# Patient Record
Sex: Male | Born: 1960
Health system: Southern US, Community
[De-identification: ages and names within clinical notes are randomized; demographics above are authoritative.]

## PROBLEM LIST (undated history)

## (undated) DIAGNOSIS — I10 Essential (primary) hypertension: Secondary | ICD-10-CM

---

## 2018-01-31 ENCOUNTER — Encounter (HOSPITAL_COMMUNITY): Payer: Self-pay | Admitting: Emergency Medicine

## 2018-01-31 ENCOUNTER — Other Ambulatory Visit: Payer: Self-pay

## 2018-01-31 ENCOUNTER — Emergency Department (HOSPITAL_COMMUNITY): Payer: Self-pay

## 2018-01-31 ENCOUNTER — Inpatient Hospital Stay (HOSPITAL_COMMUNITY)
Admission: EM | Admit: 2018-01-31 | Discharge: 2018-02-07 | DRG: 445 | Disposition: A | Discharge status: Home / Self Care | Payer: Self-pay | Attending: Internal Medicine | Admitting: Internal Medicine

## 2018-01-31 ENCOUNTER — Inpatient Hospital Stay (HOSPITAL_COMMUNITY): Payer: Self-pay

## 2018-01-31 DIAGNOSIS — C240 Malignant neoplasm of extrahepatic bile duct: Secondary | ICD-10-CM

## 2018-01-31 DIAGNOSIS — R7401 Elevation of levels of liver transaminase levels: Secondary | ICD-10-CM

## 2018-01-31 DIAGNOSIS — F1210 Cannabis abuse, uncomplicated: Secondary | ICD-10-CM | POA: Diagnosis present

## 2018-01-31 DIAGNOSIS — R740 Nonspecific elevation of levels of transaminase and lactic acid dehydrogenase [LDH]: Secondary | ICD-10-CM

## 2018-01-31 DIAGNOSIS — R109 Unspecified abdominal pain: Secondary | ICD-10-CM

## 2018-01-31 DIAGNOSIS — D630 Anemia in neoplastic disease: Secondary | ICD-10-CM | POA: Diagnosis present

## 2018-01-31 DIAGNOSIS — Z6822 Body mass index (BMI) 22.0-22.9, adult: Secondary | ICD-10-CM

## 2018-01-31 DIAGNOSIS — R17 Unspecified jaundice: Secondary | ICD-10-CM | POA: Diagnosis present

## 2018-01-31 DIAGNOSIS — F1010 Alcohol abuse, uncomplicated: Secondary | ICD-10-CM

## 2018-01-31 DIAGNOSIS — F17210 Nicotine dependence, cigarettes, uncomplicated: Secondary | ICD-10-CM | POA: Diagnosis present

## 2018-01-31 DIAGNOSIS — R1011 Right upper quadrant pain: Secondary | ICD-10-CM

## 2018-01-31 DIAGNOSIS — C23 Malignant neoplasm of gallbladder: Secondary | ICD-10-CM | POA: Diagnosis present

## 2018-01-31 DIAGNOSIS — E440 Moderate protein-calorie malnutrition: Secondary | ICD-10-CM

## 2018-01-31 DIAGNOSIS — R1010 Upper abdominal pain, unspecified: Secondary | ICD-10-CM

## 2018-01-31 DIAGNOSIS — Z597 Insufficient social insurance and welfare support: Secondary | ICD-10-CM

## 2018-01-31 DIAGNOSIS — R630 Anorexia: Secondary | ICD-10-CM

## 2018-01-31 DIAGNOSIS — Z720 Tobacco use: Secondary | ICD-10-CM

## 2018-01-31 DIAGNOSIS — F1410 Cocaine abuse, uncomplicated: Secondary | ICD-10-CM | POA: Diagnosis present

## 2018-01-31 DIAGNOSIS — K831 Obstruction of bile duct: Principal | ICD-10-CM

## 2018-01-31 DIAGNOSIS — Z9114 Patient's other noncompliance with medication regimen: Secondary | ICD-10-CM

## 2018-01-31 DIAGNOSIS — C772 Secondary and unspecified malignant neoplasm of intra-abdominal lymph nodes: Secondary | ICD-10-CM | POA: Diagnosis present

## 2018-01-31 DIAGNOSIS — E876 Hypokalemia: Secondary | ICD-10-CM | POA: Diagnosis present

## 2018-01-31 DIAGNOSIS — I10 Essential (primary) hypertension: Secondary | ICD-10-CM | POA: Diagnosis present

## 2018-01-31 DIAGNOSIS — Z590 Homelessness: Secondary | ICD-10-CM

## 2018-01-31 DIAGNOSIS — F1910 Other psychoactive substance abuse, uncomplicated: Secondary | ICD-10-CM

## 2018-01-31 DIAGNOSIS — R945 Abnormal results of liver function studies: Secondary | ICD-10-CM

## 2018-01-31 DIAGNOSIS — R110 Nausea: Secondary | ICD-10-CM

## 2018-01-31 HISTORY — DX: Essential (primary) hypertension: I10

## 2018-01-31 LAB — CBC WITH DIFFERENTIAL/PLATELET
BASOS PCT: 0 %
Basophils Absolute: 0 10*3/uL (ref 0.0–0.1)
EOS PCT: 1 %
Eosinophils Absolute: 0.1 10*3/uL (ref 0.0–0.7)
HEMATOCRIT: 31.7 % — AB (ref 39.0–52.0)
HEMOGLOBIN: 10.9 g/dL — AB (ref 13.0–17.0)
LYMPHS PCT: 16 %
Lymphs Abs: 1.7 10*3/uL (ref 0.7–4.0)
MCH: 30.9 pg (ref 26.0–34.0)
MCHC: 34.4 g/dL (ref 30.0–36.0)
MCV: 89.8 fL (ref 78.0–100.0)
Monocytes Absolute: 0.9 10*3/uL (ref 0.1–1.0)
Monocytes Relative: 9 %
NEUTROS ABS: 7.7 10*3/uL (ref 1.7–7.7)
Neutrophils Relative %: 74 %
Platelets: 405 10*3/uL — ABNORMAL HIGH (ref 150–400)
RBC: 3.53 MIL/uL — ABNORMAL LOW (ref 4.22–5.81)
RDW: 18.2 % — ABNORMAL HIGH (ref 11.5–15.5)
WBC: 10.4 10*3/uL (ref 4.0–10.5)

## 2018-01-31 LAB — COMPREHENSIVE METABOLIC PANEL
ALBUMIN: 2.5 g/dL — AB (ref 3.5–5.0)
ALK PHOS: 525 U/L — AB (ref 38–126)
ALT: 144 U/L — ABNORMAL HIGH (ref 17–63)
ANION GAP: 10 (ref 5–15)
AST: 139 U/L — ABNORMAL HIGH (ref 15–41)
BILIRUBIN TOTAL: 25.5 mg/dL — AB (ref 0.3–1.2)
BUN: 6 mg/dL (ref 6–20)
CO2: 23 mmol/L (ref 22–32)
Calcium: 9.3 mg/dL (ref 8.9–10.3)
Chloride: 101 mmol/L (ref 101–111)
Creatinine, Ser: 0.4 mg/dL — ABNORMAL LOW (ref 0.61–1.24)
GFR calc non Af Amer: >60 mL/min (ref >60)
GLUCOSE: 93 mg/dL (ref 65–99)
Potassium: 3.2 mmol/L — ABNORMAL LOW (ref 3.5–5.1)
Sodium: 134 mmol/L — ABNORMAL LOW (ref 135–145)
Total Protein: 7 g/dL (ref 6.5–8.1)

## 2018-01-31 LAB — URINALYSIS, ROUTINE W REFLEX MICROSCOPIC
GLUCOSE, UA: NEGATIVE mg/dL
Hgb urine dipstick: NEGATIVE
KETONES UR: NEGATIVE mg/dL
LEUKOCYTES UA: NEGATIVE
Nitrite: NEGATIVE
PH: 6 (ref 5.0–8.0)
PROTEIN: NEGATIVE mg/dL
Specific Gravity, Urine: 1.012 (ref 1.005–1.030)

## 2018-01-31 LAB — RAPID URINE DRUG SCREEN, HOSP PERFORMED
AMPHETAMINES: NOT DETECTED
Barbiturates: NOT DETECTED
Benzodiazepines: NOT DETECTED
COCAINE: POSITIVE — AB
OPIATES: NOT DETECTED
Tetrahydrocannabinol: NOT DETECTED

## 2018-01-31 LAB — LIPASE, BLOOD: Lipase: 41 U/L (ref 11–51)

## 2018-01-31 LAB — CREATININE, SERUM
CREATININE: 0.37 mg/dL — AB (ref 0.61–1.24)
GFR calc Af Amer: >60 mL/min (ref >60)

## 2018-01-31 LAB — I-STAT CG4 LACTIC ACID, ED: Lactic Acid, Venous: 0.65 mmol/L (ref 0.5–1.9)

## 2018-01-31 LAB — ETHANOL: Alcohol, Ethyl (B): <10 mg/dL (ref <10)

## 2018-01-31 LAB — HIV ANTIBODY (ROUTINE TESTING W REFLEX): HIV Screen 4th Generation wRfx: NONREACTIVE

## 2018-01-31 LAB — CBC
HEMATOCRIT: 29.6 % — AB (ref 39.0–52.0)
HEMOGLOBIN: 10.2 g/dL — AB (ref 13.0–17.0)
MCH: 30.7 pg (ref 26.0–34.0)
MCHC: 34.5 g/dL (ref 30.0–36.0)
MCV: 89.2 fL (ref 78.0–100.0)
Platelets: 387 10*3/uL (ref 150–400)
RBC: 3.32 MIL/uL — ABNORMAL LOW (ref 4.22–5.81)
RDW: 18.2 % — AB (ref 11.5–15.5)
WBC: 10.5 10*3/uL (ref 4.0–10.5)

## 2018-01-31 LAB — PROTIME-INR
INR: 0.97
Prothrombin Time: 12.8 seconds (ref 11.4–15.2)

## 2018-01-31 LAB — GLUCOSE, CAPILLARY
GLUCOSE-CAPILLARY: 66 mg/dL (ref 65–99)
GLUCOSE-CAPILLARY: 113 mg/dL — AB (ref 65–99)
Glucose-Capillary: 121 mg/dL — ABNORMAL HIGH (ref 65–99)

## 2018-01-31 MED ORDER — THIAMINE HCL 100 MG/ML IJ SOLN: 100.0000 mg | Freq: Every day | INTRAMUSCULAR | Status: DC

## 2018-01-31 MED ORDER — ENSURE ENLIVE PO LIQD: 237.0000 mL | Freq: Two times a day (BID) | ORAL | Status: DC

## 2018-01-31 MED ORDER — GUAIFENESIN-DM 100-10 MG/5ML PO SYRP: 5.0000 mL | ORAL_SOLUTION | ORAL | Status: DC | PRN

## 2018-01-31 MED ORDER — VITAMIN B-1 100 MG PO TABS
100.0000 mg | ORAL_TABLET | Freq: Every day | ORAL | Status: DC
  Administered 2018-01-31 – 2018-02-07 (×7): 100 mg via ORAL
  Filled 2018-01-31 (×7): qty 1

## 2018-01-31 MED ORDER — PHENOL 1.4 % MT LIQD
1.0000 | OROMUCOSAL | Status: DC | PRN
  Filled 2018-01-31: qty 177

## 2018-01-31 MED ORDER — FOLIC ACID 1 MG PO TABS
1.0000 mg | ORAL_TABLET | Freq: Every day | ORAL | Status: DC
  Administered 2018-01-31 – 2018-02-07 (×7): 1 mg via ORAL
  Filled 2018-01-31 (×7): qty 1

## 2018-01-31 MED ORDER — POLYVINYL ALCOHOL 1.4 % OP SOLN
1.0000 [drp] | OPHTHALMIC | Status: DC | PRN
  Filled 2018-01-31: qty 15

## 2018-01-31 MED ORDER — MUSCLE RUB 10-15 % EX CREA
1.0000 "application " | TOPICAL_CREAM | CUTANEOUS | Status: DC | PRN
  Filled 2018-01-31: qty 85

## 2018-01-31 MED ORDER — LIP MEDEX EX OINT
1.0000 "application " | TOPICAL_OINTMENT | CUTANEOUS | Status: DC | PRN
  Filled 2018-01-31 (×2): qty 7

## 2018-01-31 MED ORDER — ADULT MULTIVITAMIN W/MINERALS CH
1.0000 | ORAL_TABLET | Freq: Every day | ORAL | Status: DC
  Administered 2018-01-31 – 2018-02-07 (×7): 1 via ORAL
  Filled 2018-01-31 (×7): qty 1

## 2018-01-31 MED ORDER — SALINE SPRAY 0.65 % NA SOLN
1.0000 | NASAL | Status: DC | PRN
  Filled 2018-01-31: qty 44

## 2018-01-31 MED ORDER — PIPERACILLIN-TAZOBACTAM 3.375 G IVPB 30 MIN
3.3750 g | Freq: Once | INTRAVENOUS | Status: AC
  Administered 2018-01-31: 3.375 g via INTRAVENOUS
  Filled 2018-01-31: qty 50

## 2018-01-31 MED ORDER — ALUM & MAG HYDROXIDE-SIMETH 200-200-20 MG/5ML PO SUSP
30.0000 mL | ORAL | Status: DC | PRN
  Administered 2018-02-03 – 2018-02-05 (×2): 30 mL via ORAL
  Filled 2018-01-31 (×2): qty 30

## 2018-01-31 MED ORDER — INSULIN ASPART 100 UNIT/ML ~~LOC~~ SOLN
0.0000 [IU] | Freq: Three times a day (TID) | SUBCUTANEOUS | Status: DC
  Administered 2018-02-02: 5 [IU] via SUBCUTANEOUS
  Administered 2018-02-04 – 2018-02-07 (×3): 1 [IU] via SUBCUTANEOUS

## 2018-01-31 MED ORDER — HYDROCORTISONE 2.5 % RE CREA
1.0000 "application " | TOPICAL_CREAM | Freq: Four times a day (QID) | RECTAL | Status: DC | PRN
  Filled 2018-01-31: qty 28.35

## 2018-01-31 MED ORDER — SODIUM CHLORIDE 0.9 % IJ SOLN
INTRAMUSCULAR | Status: AC
  Filled 2018-01-31: qty 50

## 2018-01-31 MED ORDER — LORAZEPAM 2 MG/ML IJ SOLN: 1.0000 mg | Freq: Four times a day (QID) | INTRAMUSCULAR | Status: AC | PRN

## 2018-01-31 MED ORDER — IOPAMIDOL (ISOVUE-300) INJECTION 61%
100.0000 mL | Freq: Once | INTRAVENOUS | Status: AC | PRN
  Administered 2018-01-31: 100 mL via INTRAVENOUS

## 2018-01-31 MED ORDER — HYDROCODONE-ACETAMINOPHEN 5-325 MG PO TABS
1.0000 | ORAL_TABLET | ORAL | Status: DC | PRN
  Administered 2018-01-31 – 2018-02-06 (×2): 1 via ORAL
  Filled 2018-01-31 (×2): qty 1

## 2018-01-31 MED ORDER — IOPAMIDOL (ISOVUE-300) INJECTION 61%
INTRAVENOUS | Status: AC
  Administered 2018-01-31: 30 mL via ORAL
  Filled 2018-01-31: qty 30

## 2018-01-31 MED ORDER — HYDROCORTISONE 1 % EX CREA
1.0000 "application " | TOPICAL_CREAM | Freq: Three times a day (TID) | CUTANEOUS | Status: DC | PRN
  Filled 2018-01-31: qty 28

## 2018-01-31 MED ORDER — IOPAMIDOL (ISOVUE-300) INJECTION 61%
INTRAVENOUS | Status: AC
  Filled 2018-01-31: qty 100

## 2018-01-31 MED ORDER — HEPARIN SODIUM (PORCINE) 5000 UNIT/ML IJ SOLN
5000.0000 [IU] | Freq: Three times a day (TID) | INTRAMUSCULAR | Status: DC
  Administered 2018-01-31 – 2018-02-07 (×20): 5000 [IU] via SUBCUTANEOUS
  Filled 2018-01-31 (×21): qty 1

## 2018-01-31 MED ORDER — SODIUM CHLORIDE 0.9 % IV BOLUS (SEPSIS)
1000.0000 mL | Freq: Once | INTRAVENOUS | Status: AC
  Administered 2018-01-31: 1000 mL via INTRAVENOUS

## 2018-01-31 MED ORDER — LORAZEPAM 1 MG PO TABS: 1.0000 mg | ORAL_TABLET | Freq: Four times a day (QID) | ORAL | Status: AC | PRN

## 2018-01-31 MED ORDER — IOPAMIDOL (ISOVUE-300) INJECTION 61%: 30.0000 mL | Freq: Once | INTRAVENOUS | Status: DC

## 2018-01-31 MED ORDER — SENNOSIDES-DOCUSATE SODIUM 8.6-50 MG PO TABS
1.0000 | ORAL_TABLET | Freq: Every evening | ORAL | Status: DC | PRN
  Administered 2018-02-05: 1 via ORAL
  Filled 2018-01-31: qty 1

## 2018-01-31 MED ORDER — PIPERACILLIN-TAZOBACTAM 3.375 G IVPB
3.3750 g | Freq: Three times a day (TID) | INTRAVENOUS | Status: DC
  Administered 2018-01-31 – 2018-02-04 (×11): 3.375 g via INTRAVENOUS
  Filled 2018-01-31 (×8): qty 50

## 2018-01-31 MED ORDER — ENSURE ENLIVE PO LIQD
237.0000 mL | Freq: Three times a day (TID) | ORAL | Status: DC
  Administered 2018-02-01 – 2018-02-05 (×5): 237 mL via ORAL

## 2018-01-31 MED ORDER — DEXTROSE-NACL 5-0.45 % IV SOLN
INTRAVENOUS | Status: AC
  Administered 2018-01-31 (×2): via INTRAVENOUS

## 2018-01-31 MED ORDER — LORATADINE 10 MG PO TABS: 10.0000 mg | ORAL_TABLET | Freq: Every day | ORAL | Status: DC | PRN

## 2018-01-31 NOTE — ED Notes (Signed)
Pt has urinal at bedside 

## 2018-01-31 NOTE — Progress Notes (Addendum)
Pharmacy Antibiotic Note  Leroy Harrell is a 57 y.o. male admitted on 01/31/2018 with c/o abd pain/fatigue, jaundice. LFTs elevated.  Pharmacy has been consulted for Zosyn dosing for Intra-abd infection.  Plan: Zosyn 3.375g IV q8h (4 hour infusion).    Temp (24hrs), Avg:98.8 F (37.1 C), Min:98.8 F (37.1 C), Max:98.8 F (37.1 C)  Recent Labs  Lab 01/31/18 0544 01/31/18 0555  WBC 10.4  --   CREATININE 0.40*  --   LATICACIDVEN  --  0.65    CrCl cannot be calculated (Unknown ideal weight.).    No Known Allergies  Antimicrobials this admission: 3/22 Zosyn >>    Microbiology results: 3/22 BCx: sent 3/22 HIV Ab: sent  Pharmacy will sign-off note writing, will adjust dose/schedule if necessary Thank you for allowing pharmacy to be a part of this patient's care.  Minda Ditto PharmD Pager [phone] 01/31/2018, 10:59 AM

## 2018-01-31 NOTE — Consult Note (Addendum)
Referring Provider: Dr. Reesa Chew Primary Care Physician:  System, Pcp Not In Primary Gastroenterologist:  Althia Forts, [place]  Reason for Consultation:  Obstructive jaundice  HPI: Leroy Harrell is a 57 y.o. male with medical history significant of essential hypertension, alcohol abuse, tobacco abuse, polysubstance abuse comes to the hospital for evaluation of abdominal pain and fatigue.  Patient states he has been experiencing fatigue, malaise, yellowish discoloration of his eyes and gas-like abdominal pain especially in the right upper quadrant/upper abdomen for the past 4 days.  During this time he has felt mildly nauseous, had subjective fevers and chills, malaise and fatigue.  Due to this his appetite has been extremely poor and has not had pretty much anything to eat or drink.  Admits to about a 10 pound weight loss over the past month or so.  He was driving down from [place] where he was visiting his brother back to [place] where he lives, but his symptoms worsened, therefore, came to the hospital for evaluation.  Had noticed his eyes turning very yellow about 3-4 days ago as well.  Urine is very dark in color and stools are light in color.  Patient denies any use of Tylenol, NSAIDs.  He does admit of drinking heavily for at least 10 years.  He says that he drinks about 1 pint of vodka or 6 beers daily at least 3-4 times (days) per week.  Admits of using crack cocaine and marijuana frequently.  Denies any previous history or issues with gallbladder, gallstone, peptic ulcer disease or any other abdominal issues.  Upon lab evaluation he was found to have the following:  Hgb low at 10.9 grams, normocytic.  K+ 3.2.  Albumin 2.5, AST 139, ALT 144, ALP 525, total bili 25.5.  INR 0.97.  Ultrasound of the abdomen was performed and showed the following:   IMPRESSION: 1. Dilated common bile duct with intrahepatic biliary ductal dilatation of uncertain etiology. No  obstructing mass or choledocholithiasis is identified. Recommend further evaluation with MR abdomen/MRCP. 2. Severe gallbladder wall thickening measuring up to 10 mm with a trace amount of pericholecystic fluid without cholelithiasis. This appearance can be seen in the setting of acalculus cholecystitis.   Past Medical History:  Diagnosis Date  . Hypertension    History reviewed. No pertinent surgical history.  Prior to Admission medications   Not on File    Current Facility-Administered Medications  Medication Dose Route Frequency Provider Last Rate Last Dose  . alum & mag hydroxide-simeth (MAALOX/MYLANTA) 200-200-20 MG/5ML suspension 30 mL  30 mL Oral Q4H PRN Amin, Ankit Chirag, MD      . dextrose 5 %-0.45 % sodium chloride infusion   Intravenous Continuous Amin, Ankit Chirag, MD      . folic acid (FOLVITE) tablet 1 mg  1 mg Oral Daily Amin, Ankit Chirag, MD      . guaiFENesin-dextromethorphan (ROBITUSSIN DM) 100-10 MG/5ML syrup 5 mL  5 mL Oral Q4H PRN Amin, Ankit Chirag, MD      . heparin injection 5,000 Units  5,000 Units Subcutaneous Q8H Amin, Ankit Chirag, MD      . HYDROcodone-acetaminophen (NORCO/VICODIN) 5-325 MG per tablet 1-2 tablet  1-2 tablet Oral Q4H PRN Amin, Ankit Chirag, MD      . hydrocortisone (ANUSOL-HC) 2.5 % rectal cream 1 application  1 application Topical QID PRN Amin, Ankit Chirag, MD      . hydrocortisone cream 1 % 1 application  1 application Topical TID PRN  Amin, Ankit Chirag, MD      . insulin aspart (novoLOG) injection 0-9 Units  0-9 Units Subcutaneous TID WC Amin, Ankit Chirag, MD      . iopamidol (ISOVUE-300) 61 % injection 30 mL  30 mL Oral Once Amin, Ankit Chirag, MD      . lip balm (CARMEX) ointment 1 application  1 application Topical PRN Amin, Ankit Chirag, MD      . loratadine (CLARITIN) tablet 10 mg  10 mg Oral Daily PRN Amin, Ankit Chirag, MD      . LORazepam (ATIVAN) tablet 1 mg  1 mg Oral Q6H PRN Amin, Jeanella Flattery, MD       Or  . LORazepam  (ATIVAN) injection 1 mg  1 mg Intravenous Q6H PRN Amin, Ankit Chirag, MD      . multivitamin with minerals tablet 1 tablet  1 tablet Oral Daily Amin, Ankit Chirag, MD      . MUSCLE RUB CREA 1 application  1 application Topical PRN Amin, Ankit Chirag, MD      . phenol (CHLORASEPTIC) mouth spray 1 spray  1 spray Mouth/Throat PRN Amin, Ankit Chirag, MD      . piperacillin-tazobactam (ZOSYN) IVPB 3.375 g  3.375 g Intravenous Once Nyoka Cowden, Terri L, RPH      . polyvinyl alcohol (LIQUIFILM TEARS) 1.4 % ophthalmic solution 1 drop  1 drop Both Eyes PRN Amin, Ankit Chirag, MD      . senna-docusate (Senokot-S) tablet 1 tablet  1 tablet Oral QHS PRN Amin, Ankit Chirag, MD      . sodium chloride (OCEAN) 0.65 % nasal spray 1 spray  1 spray Each Nare PRN Amin, Ankit Chirag, MD      . thiamine (VITAMIN B-1) tablet 100 mg  100 mg Oral Daily Amin, Ankit Chirag, MD       Or  . thiamine (B-1) injection 100 mg  100 mg Intravenous Daily Amin, Jeanella Flattery, MD       No current outpatient medications on file.    Allergies as of 01/31/2018  . (No Known Allergies)    No family history on file.  Social History   Socioeconomic History  . Marital status: Single    Spouse name: Not on file  . Number of children: Not on file  . Years of education: Not on file  . Highest education level: Not on file  Occupational History  . Not on file  Social Needs  . Financial resource strain: Not on file  . Food insecurity:    Worry: Not on file    Inability: Not on file  . Transportation needs:    Medical: Not on file    Non-medical: Not on file  Tobacco Use  . Smoking status: Current Every Day Smoker    Packs/day: 0.50    Years: 30.00    Pack years: 15.00    Types: Cigarettes  . Smokeless tobacco: Never Used  Substance and Sexual Activity  . Alcohol use: Yes    Comment: occasionally  . Drug use: Yes    Frequency: 1.0 times per week    Types: Cocaine  . Sexual activity: Not on file  Lifestyle  . Physical  activity:    Days per week: Not on file    Minutes per session: Not on file  . Stress: Not on file  Relationships  . Social connections:    Talks on phone: Not on file    Gets together: Not on file  Attends religious service: Not on file    Active member of club or organization: Not on file    Attends meetings of clubs or organizations: Not on file    Relationship status: Not on file  . Intimate partner violence:    Fear of current or ex partner: Not on file    Emotionally abused: Not on file    Physically abused: Not on file    Forced sexual activity: Not on file  Other Topics Concern  . Not on file  Social History Narrative  . Not on file    Review of Systems: ROS is O/W negative except as mentioned in HPI.  Physical Exam: Vital signs in last 24 hours: Temp:  [98.8 F (37.1 C)] 98.8 F (37.1 C) (03/22 0508) Pulse Rate:  [61-82] 65 (03/22 0953) Resp:  [16-18] 18 (03/22 0953) BP: (128-152)/(81-91) 136/91 (03/22 0953) SpO2:  [96 %-99 %] 96 % (03/22 0953)   General:  Alert, Well-developed, well-nourished, pleasant and cooperative in NAD Head:  Normocephalic and atraumatic. Eyes:  Deep scleral icterus noted. Ears:  Normal auditory acuity. Mouth:  No deformity or lesions.   Lungs:  Clear throughout to auscultation.   No wheezes, crackles, or rhonchi.  Heart:  Regular rate and rhythm; no murmurs, clicks, rubs, or gallops. Abdomen:  Soft, non-distended.  BS present.  Mild upper abdominal TTP. Rectal:  Deferred  Msk:  Symmetrical without gross deformities. Pulses:  Normal pulses noted. Extremities:  Without clubbing or edema. Neurologic:  Alert and oriented x 4;  grossly normal neurologically. Skin:  Intact without significant lesions or rashes. .Psych:  Alert and cooperative. Normal mood and affect.  Lab Results: Recent Labs    01/31/18 0544  WBC 10.4  HGB 10.9*  HCT 31.7*  PLT 405*   BMET Recent Labs    01/31/18 0544  NA 134*  K 3.2*  CL 101  CO2 23    GLUCOSE 93  BUN 6  CREATININE 0.40*  CALCIUM 9.3   LFT Recent Labs    01/31/18 0544  PROT 7.0  ALBUMIN 2.5*  AST 139*  ALT 144*  ALKPHOS 525*  BILITOT 25.5*   PT/INR Recent Labs    01/31/18 0544  LABPROT 12.8  INR 0.97   Studies/Results: US Abdomen Complete  Result Date: 01/31/2018 CLINICAL DATA:  Jaundiced, nauseous EXAM: ABDOMEN ULTRASOUND COMPLETE COMPARISON:  None. FINDINGS: Gallbladder: No cholelithiasis. Gallbladder sludge is present. Severe gallbladder wall thickening measuring up to 10 mm. Trace pericholecystic fluid. Negative sonographic Murphy sign. Common bile duct: Diameter: 10.2 mm Liver: No focal lesion identified. Within normal limits in parenchymal echogenicity. Moderate intrahepatic biliary ductal dilatation in the right and left hepatic lobes. Portal vein is patent on color Doppler imaging with normal direction of blood flow towards the liver. IVC: No abnormality visualized. Pancreas: Visualized portion unremarkable. Spleen: Size and appearance within normal limits. Right Kidney: Length: 11.2 cm. Echogenicity within normal limits. No mass or hydronephrosis visualized. Left Kidney: Length: 11.2 cm. 1.2 x 1.6 x 1.1 cm anechoic left renal mass most consistent with a cyst. Echogenicity within normal limits. No solid mass or hydronephrosis visualized. Abdominal aorta: No aneurysm visualized. Other findings: None. IMPRESSION: 1. Dilated common bile duct with intrahepatic biliary ductal dilatation of uncertain etiology. No obstructing mass or choledocholithiasis is identified. Recommend further evaluation with MR abdomen/MRCP. 2. Severe gallbladder wall thickening measuring up to 10 mm with a trace amount of pericholecystic fluid without cholelithiasis. This appearance can be seen in the setting of  a calculus cholecystitis. Electronically Signed   By: Kathreen Devoid   On: 01/31/2018 09:33   IMPRESSION:  -Obstructive jaundice of unknown source.  ? Stone vs mass vs stricture,  etc. Total bili 25.  Upper abdominal tenderness.  PLAN: -Will await results of CT scan. -Agree with IV antibiotics, zosyn. -Monitor labs.   Laban Emperor. Zehr  01/31/2018, 10:04 AM  Pager number [phone]  I have reviewed the entire case in detail with the above APP and discussed the plan in detail.  Therefore, I agree with the diagnoses recorded above. In addition,  I have personally interviewed and examined the patient and have personally reviewed any abdominal/pelvic CT scan images.  My additional thoughts are as follows:  57 yo man with Etoh and drug use , no prior Hx pancreatitis or known GB problems, (does not seek regular medical care), presents with > 1 month 10-15 pound weight loss and now several days of upper abdominal pain, nausea, anorexia, malaise and jaundice and acholic stool.  Has RUQ TTP and GB wall thickening on [place], but I suspect this is obstructive jaundice.  Entire clinical scenario worrisome for pancreatic or distal biliary malignancy.  He may have superimposed biliary infection on top of this chronic process, so I agree with broad spectrum Abx to cover biliary tree. I advised [facility] doc to order CTAP with oral and IV contrast. He can have a regular diet.  We will follow him over the weekend.   [name] Pager [phone]  Mon-Fri 8a-5p [phone] after 5p, weekends, holidays

## 2018-01-31 NOTE — Progress Notes (Signed)
Initial Nutrition Assessment  DOCUMENTATION CODES:   Non-severe (moderate) malnutrition in context of social or environmental circumstances  INTERVENTION:   - Ensure Enlive po TID, each supplement provides 350 kcal and 20 grams of protein  - Double portions of protein at each meal  - Continue MVI with minerals  Monitor magnesium, potassium, and phosphorus daily for at least 3 days, MD to replete as needed, as pt is at risk for refeeding syndrome given moderate protein-calorie malnutrition, significant recent weight loss, no PO intake over the past 2 days, and hypokalemia upon admission.  NUTRITION DIAGNOSIS:   Moderate Malnutrition related to social / environmental circumstances(ETOH abuse, polysubstance abuse) as evidenced by percent weight loss, moderate muscle depletion, moderate fat depletion(17% weight loss in 3 months).  GOAL:   Patient will meet greater than or equal to 90% of their needs  MONITOR:   PO intake, Supplement acceptance, Labs, Weight trends  REASON FOR ASSESSMENT:   Malnutrition Screening Tool   ASSESSMENT:   57 year old male who presented to ED with body aches, chills, weakness, loss of appetite, jaundice, and emesis (2 episodes per day). Pt with PMH significant for HTN, alcohol abuse (has worsened recently), tobacco abuse, and polysubstance abuse.   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 Pt receiving second lunch tray at time of visit (chicken sandwich, chips, apple, Sprite) as he was very hungry, stating he hasn't eaten at all over the past 2 days due to feeling sick. Pt is pleased that his "appetite is coming back." Pt states that while he was in [place] with his brother over the past 2 months, he was eating less than usual (approximately 2 meals daily). A meal might consist of a sandwich. Prior to this time, pt states he was "eating well" and  cooking for himself at home.  Pt asked when he would receive his first Ensure. RD brought Ensure Enlive to pt and alerted RN.  Pt states that after his daughter's death 3 months ago, he has not been "eating properly" and has been consuming more alcohol during this time. Per H&P, pt was consuming approximately 1 pint of vodka almost daily and 6 beers/day 4 times a week.  Pt reports his UBW is 174 lbs and that he last weighed this 3 months ago. No weight history in chart. Pt endorses recent weight loss. Pt has experienced a 17% weight loss over the past 3 months which is significant for timeframe.  Medications reviewed and include: 1 mg folic acid daily, sliding scale Novolog, MVI with minerals, 100 mg thiamine daily, D5 infusing @ 100 mL/hr (provides 408 kcal/day), IV antibiotics  Labs reviewed: creatinine 0.37, sodium 134, potassium 3.2, alkaline phosphatase 525, AST 139, ALT 144  NUTRITION - FOCUSED PHYSICAL EXAM:    Most Recent Value  Orbital Region  Mild depletion  Upper Arm Region  Moderate depletion  Thoracic and Lumbar Region  Moderate depletion  Buccal Region  Mild depletion  Temple Region  Mild depletion  Clavicle Bone Region  Moderate depletion  Clavicle and Acromion Bone Region  Moderate depletion  Scapular Bone Region  Mild depletion  Dorsal Hand  No depletion  Patellar Region  Mild depletion  Anterior Thigh Region  Mild depletion  Posterior Calf Region  Mild depletion  Edema (RD Assessment)  None  Hair  Reviewed  Eyes  Reviewed  Mouth  Reviewed  Skin  Reviewed  Nails  Reviewed  Diet Order:  Diet regular Room service appropriate? Yes; Fluid consistency: Thin  EDUCATION NEEDS:   Education needs have been addressed  Skin:  Skin Assessment: Reviewed RN Assessment(jaundice)  Last BM:  01/31/18  Height:   Ht Readings from Last 1 Encounters:  01/31/18 5\' 7"  (1.702 m)    Weight:   Wt Readings from Last 1 Encounters:  01/31/18 144 lb 13.5 oz (65.7 kg)     Ideal Body Weight:  67.3 kg  BMI:  Body mass index is 22.69 kg/m.  Estimated Nutritional Needs:   Kcal:  1700-1900 kcal/day  Protein:  80-95 grams/day  Fluid:  1.7-1.9 L/day    Gaynell Face, MS, RD, LDN Pager: [phone] Weekend/After Hours: [phone]

## 2018-01-31 NOTE — ED Triage Notes (Signed)
Pt arriving with generalized body aches, chills, weakness, and loss of appetite. Pt concerned he may have Hepatitis. Jaundice evident in pt eyes. Pt states his eyes are lights now than they were earlier today.

## 2018-01-31 NOTE — ED Provider Notes (Signed)
[facility] DEPT Provider Note   CSN: [id number] Arrival date & time: 01/31/18  0449     History   Chief Complaint Chief Complaint  Patient presents with  . Jaundice  . Generalized Body Aches    HPI Leroy Harrell is a 57 y.o. male.  Patient is a 57 year old male who presents to the emergency department for evaluation of jaundice.  He is in route from [place] back to his home town in [place], but has been experiencing increasing fatigue and anorexia.  He has noticed these symptoms over the past 5-6 days.  He has since developed pain in his epigastric abdomen with sporadic vomiting throughout the day.  He reports approximately 2 episodes of emesis daily.  He has been having slightly looser stool which have been nonbloody.  He started to notice some discoloration over the past 3-4 days, specifically yellowing of the eyes.  He has had increased discoloration of his urine which is more brown in color.  He denies any recent fevers, but has had chills.  Denies any Tylenol use, IV drug use.  He does have a history of consistent crack cocaine use.  This was last used 2 days ago.  He has been drinking more heavily as of late; approximately 1 pint of liquor daily.  He denies noticing any increased bleeding or bruising.  No hx of abdominal surgeries.  The history is provided by the patient. No language interpreter was used.    Past Medical History:  Diagnosis Date  . Hypertension     There are no active problems to display for this patient.   History reviewed. No pertinent surgical history.     Home Medications    Prior to Admission medications   Not on File    Family History No family history on file.  Social History Social History   Tobacco Use  . Smoking status: Current Every Day Smoker    Packs/day: 0.50    Years: 30.00    Pack years: 15.00    Types: Cigarettes  . Smokeless tobacco: Never Used  Substance Use Topics  . Alcohol  use: Yes    Comment: occasionally  . Drug use: Yes    Frequency: 1.0 times per week    Types: Cocaine     Allergies   Patient has no known allergies.   Review of Systems Review of Systems Ten systems reviewed and are negative for acute change, except as noted in the HPI.    Physical Exam Updated Vital Signs BP (!) 142/81 (BP Location: Right Arm)   Pulse 71   Temp 98.8 F (37.1 C) (Oral)   Resp 18   SpO2 99%   Physical Exam  Constitutional: He is oriented to person, place, and time. He appears well-developed and well-nourished. No distress.  Nontoxic and in NAD  HENT:  Head: Normocephalic and atraumatic.  Eyes: Conjunctivae and EOM are normal. Scleral icterus is present.  Neck: Normal range of motion.  Cardiovascular: Normal rate, regular rhythm and intact distal pulses.  Pulmonary/Chest: Effort normal. No stridor. No respiratory distress. He has no wheezes.  Respirations even and unlabored  Abdominal: Soft. There is tenderness. There is no guarding.  Suspect hepatomegaly with TTP in the RUQ and epigastrium. Negative Murphy's sign. No distension, ascites, peritoneal signs.  Musculoskeletal: Normal range of motion.  Neurological: He is alert and oriented to person, place, and time. He exhibits normal muscle tone. Coordination normal.  Skin: Skin is warm and  dry. No rash noted. He is not diaphoretic. No erythema. No pallor.  Skin is jaundiced  Psychiatric: He has a normal mood and affect. His behavior is normal.  Nursing note and vitals reviewed.    ED Treatments / Results  Labs (all labs ordered are listed, but only abnormal results are displayed) Labs Reviewed  CBC WITH DIFFERENTIAL/PLATELET - Abnormal; Notable for the following components:      Result Value   RBC 3.53 (*)    Hemoglobin 10.9 (*)    HCT 31.7 (*)    RDW 18.2 (*)    Platelets 405 (*)    All other components within normal limits  COMPREHENSIVE METABOLIC PANEL - Abnormal; Notable for the following  components:   Sodium 134 (*)    Potassium 3.2 (*)    Creatinine, Ser 0.40 (*)    Albumin 2.5 (*)    AST 139 (*)    ALT 144 (*)    Alkaline Phosphatase 525 (*)    Total Bilirubin 25.5 (*)    All other components within normal limits  LIPASE, BLOOD  PROTIME-INR  ETHANOL  HEPATITIS PANEL, ACUTE  HIV ANTIBODY (ROUTINE TESTING)  RAPID URINE DRUG SCREEN, HOSP PERFORMED  I-STAT CG4 LACTIC ACID, ED    EKG  EKG Interpretation None       Radiology No results found.  Procedures Procedures (including critical care time)  Medications Ordered in ED Medications  sodium chloride 0.9 % bolus 1,000 mL (1,000 mLs Intravenous New Bag/Given 01/31/18 0554)     Initial Impression / Assessment and Plan / ED Course  I have reviewed the triage vital signs and the nursing notes.  Pertinent labs & imaging results that were available during my care of the patient were reviewed by me and considered in my medical decision making (see chart for details).     57 year old male presents to the emergency department for increased fatigue and jaundice.  He is afebrile with reassuring and stable vital signs.  Laboratory workup significant for transaminitis with elevation of alk phos to 525.  Total bilirubin significantly elevated at 25.5.  Patient denies any recent Tylenol use or IV drug use, though he does smoke crack cocaine.  He has also been drinking a pint of liquor daily more recently.  He denies any specific abdominal pain complaints, though he is tender in his right upper quadrant.  An ultrasound of the right upper quadrant is pending.  He will require admission for further management and inpatient GI consultation.  Case discussed with Dr. Reesa Chew of [facility] who will assess for admission.   Final Clinical Impressions(s) / ED Diagnoses   Final diagnoses:  Hyperbilirubinemia  Transaminitis    ED Discharge Orders    None       Antonietta Breach, PA-C 01/31/18 0721    Shanon Rosser, MD 01/31/18  2234

## 2018-01-31 NOTE — H&P (Signed)
History and Physical    Lavalle Skoda OFB:[id number] DOB: [birth year] DOA: 01/31/2018  PCP: System, Pcp Not In Patient coming from: home  Chief Complaint: fatigue  HPI: Leroy Harrell is a 57 y.o. male with medical history significant of essential hypertension, alcohol abuse, tobacco abuse, polysubstance abuse comes to the hospital for evaluation of abdominal pain and fatigue.  Patient states he has been experiencing fatigue, malaise, yellowish discoloration of his eyes and gas-like abdominal pain especially in the right upper quadrant for the past 3-4 days.  During this time he has felt mildly nauseous, had subjective fevers and chills, malaise and fatigue.  Due to this his appetite has been extremely poor and has not had pretty much anything to eat or drink.  He was driving down from [place] to [place] but his symptoms worsen therefore came to the hospital for evaluation. For the past couple of days he is also noticed his skin has been turning yellowish, unable to tell me much about  color of his stools. Patient denies any use of Tylenol, NSAIDs.  He does admit of drinking heavily for at least 10 years.  He is not necessarily open about exactly how much he drinks but he tells me he drinks about 1 pint of vodka almost daily along with at least 6 beers daily at least 4 times a week.  Admits of using crack cocaine and marijuana frequently.  Denies any previous history or issues with gallbladder, gallstone, peptic ulcer disease or any other abdominal issues.   In the ER he did not appear to be in any acute distress but his total bilirubin was noted to be 25.5, AST was 139, ALT 144, lipase 41, ALP 525.  CBC was unremarkable.  Lactate was 0.65.  INR was normal at 0.97, PT was 12.8.  Alcohol level was negative.  Right upper quadrant ultrasound was ordered and it was determined to admit him for further evaluation.  Review of Systems: As per HPI otherwise 10 point review of systems negative.    Review of Systems Otherwise negative except as per HPI, including: General: Denies fever, chills, night sweats or unintended weight loss. Resp: Denies cough, wheezing, shortness of breath. Cardiac: Denies chest pain, palpitations, orthopnea, paroxysmal nocturnal dyspnea. GI: Denies  diarrhea or constipation GU: Denies dysuria, frequency, hesitancy or incontinence MS: Denies muscle aches, joint pain or swelling Neuro: Denies headache, neurologic deficits (focal weakness, numbness, tingling), abnormal gait Psych: Denies anxiety, depression, SI/HI/AVH Skin: Denies new rashes or lesions ID: Denies sick contacts, exotic exposures, travel  Past Medical History:  Diagnosis Date  . Hypertension     History reviewed. No pertinent surgical history.   reports that he has been smoking cigarettes.  He has a 15.00 pack-year smoking history. He has never used smokeless tobacco. He reports that he drinks alcohol. He reports that he has current or past drug history. Drug: Cocaine. Frequency: 1.00 time per week.  No Known Allergies  Patient thinks his father had hypertension but not sure  Prior to Admission medications   Not on File    Physical Exam: Vitals:   01/31/18 0508 01/31/18 0629  BP: (!) 152/91 (!) 142/81  Pulse: 82 71  Resp: 16 18  Temp: 98.8 F (37.1 C)   TempSrc: Oral   SpO2: 96% 99%      Constitutional: NAD, calm, comfortable Vitals:   01/31/18 0508 01/31/18 0629  BP: (!) 152/91 (!) 142/81  Pulse: 82 71  Resp: 16 18  Temp:  98.8 F (37.1 C)   TempSrc: Oral   SpO2: 96% 99%   Eyes: PERRL, lids and scleral icterus ENMT: Mucous membranes are moist. Posterior pharynx clear of any exudate or lesions.Normal dentition.  Neck: normal, supple, no masses, no thyromegaly Respiratory: clear to auscultation bilaterally, no wheezing, no crackles. Normal respiratory effort. No accessory muscle use.  Cardiovascular: Regular rate and rhythm, no murmurs / rubs / gallops. No  extremity edema. 2+ pedal pulses. No carotid bruits.  Abdomen: Abdomen is tender to palpation especially right upper quadrant without any rebound tenderness, rigidity.  No distention noted. Musculoskeletal: no clubbing / cyanosis. No joint deformity upper and lower extremities. Good ROM, no contractures. Normal muscle tone.  Skin: Skin appears jaundiced no rashes, lesions, ulcers. No induration Neurologic: CN 2-12 grossly intact. Sensation intact, DTR normal. Strength 5/5 in all 4.  Psychiatric: Normal judgment and insight. Alert and oriented x 3. Normal mood.     Labs on Admission: I have personally reviewed following labs and imaging studies  CBC: Recent Labs  Lab 01/31/18 0544  WBC 10.4  NEUTROABS 7.7  HGB 10.9*  HCT 31.7*  MCV 89.8  PLT 660*   Basic Metabolic Panel: Recent Labs  Lab 01/31/18 0544  NA 134*  K 3.2*  CL 101  CO2 23  GLUCOSE 93  BUN 6  CREATININE 0.40*  CALCIUM 9.3   GFR: CrCl cannot be calculated (Unknown ideal weight.). Liver Function Tests: Recent Labs  Lab 01/31/18 0544  AST 139*  ALT 144*  ALKPHOS 525*  BILITOT 25.5*  PROT 7.0  ALBUMIN 2.5*   Recent Labs  Lab 01/31/18 0544  LIPASE 41   No results for input(s): AMMONIA in the last 168 hours. Coagulation Profile: Recent Labs  Lab 01/31/18 0544  INR 0.97   Cardiac Enzymes: No results for input(s): CKTOTAL, CKMB, CKMBINDEX, TROPONINI in the last 168 hours. BNP (last 3 results) No results for input(s): PROBNP in the last 8760 hours. HbA1C: No results for input(s): HGBA1C in the last 72 hours. CBG: No results for input(s): GLUCAP in the last 168 hours. Lipid Profile: No results for input(s): CHOL, HDL, LDLCALC, TRIG, CHOLHDL, LDLDIRECT in the last 72 hours. Thyroid Function Tests: No results for input(s): TSH, T4TOTAL, FREET4, T3FREE, THYROIDAB in the last 72 hours. Anemia Panel: No results for input(s): VITAMINB12, FOLATE, FERRITIN, TIBC, IRON, RETICCTPCT in the last 72  hours. Urine analysis: No results found for: COLORURINE, APPEARANCEUR, LABSPEC, PHURINE, GLUCOSEU, HGBUR, BILIRUBINUR, KETONESUR, PROTEINUR, UROBILINOGEN, NITRITE, LEUKOCYTESUR Sepsis Labs: !!!!!!!!!!!!!!!!!!!!!!!!!!!!!!!!!!!!!!!!!!!! @LABRCNTIP (procalcitonin:4,lacticidven:4) )No results found for this or any previous visit (from the past 240 hour(s)).   Radiological Exams on Admission: No results found.    Assessment/Plan Principal Problem:   Abdominal pain Active Problems:   Serum total bilirubin elevated   Transaminitis   Alcohol abuse   Polysubstance abuse (HCC)   Tobacco use    Abdominal pain especially right upper quadrant Elevated total bilirubin, 25.5 Transaminitis -Admit the patient for further care and monitoring.  Concerns of acute alcoholic hepatitis versus acute cholecystitis secondary to gallstones vs pancreatic lesion? -Lipase level is normal. -Right upper quadrant ultrasound -dilated CBD with intrahepatic ductal dilation.  Severe gallbladder wall thickening up to 10 mm without any cholelithiasis. -Due to fevers and right upper quadrant pain along with some labs suggestive of possible acute cholecystitis we will go ahead and order cultures, UA.  Start him on IV Zosyn.  If remains stable we can stop this in next 24 hours -Spoke with Dr Pincus Sanes  from GI, who will see the patient today. Recommends Getting CT A/P with IV and PO contrast which has been ordered.  - Provide supportive care, IV fluids -For now we will keep him n.p.o., and paramedics as needed -Daily trend his LFTs -Hepatitis panel has been ordered  History of essential hypertension -States he supposed to be on lisinopril and hydrochlorothiazide at home but does not dosages  Alcohol abuse, 1 pint of hard liquor daily with some beers Tobacco use, half a pack of cigarettes daily Polysubstance abuse, crack cocaine and marijuana - Counseled to quit using alcohol, tobacco and illicit drug use -place him on  CIWA protocol  -Provide supportive care, nicotine patch if necessary   DVT prophylaxis: Subcutaneous heparin Code Status: Full code Family Communication: Not bedside Disposition Plan: To be determined Consults called: Gastroenterology- Dr Loletha Carrow Admission status: Inpatient admission to [facility] floor .   Given the aforementioned, the predictability of an adverse outcome is felt to be significant. I expect that the patient will require at least 2 midnights in the hospital to treat this condition.   Ankit Arsenio Loader MD [facility] Pager [phone]  If 7PM-7AM, please contact night-coverage [email or web address] Password [facility]  01/31/2018, 8:06 AM

## 2018-01-31 NOTE — ED Notes (Signed)
Fluids given PO waiting urine

## 2018-01-31 NOTE — ED Notes (Signed)
Pt has urinal at bedside and was reminded of need for the urine specimen.

## 2018-01-31 NOTE — ED Notes (Signed)
ED TO INPATIENT HANDOFF REPORT  Name/Age/Gender Leroy Harrell 57 y.o. male  Code Status    Code Status Orders  (From admission, onward)        Start     Ordered   01/31/18 0801  Full code  Continuous     01/31/18 0803    Code Status History    This patient has a current code status but no historical code status.      Home/SNF/Other Home  Chief Complaint jaundice  Level of Care/Admitting Diagnosis ED Disposition    ED Disposition Condition [facility] Area: [place] [100102]  Level of Care: Med-Surg [16]  Diagnosis: Serum total bilirubin elevated [433295]  Admitting Physician: Gerlean Ren [facility] [[id number]]  Attending Physician: Gerlean Ren [facility] [[id number]]  Estimated length of stay: 3 - 4 days  Certification:: I certify this patient will need inpatient services for at least 2 midnights  PT Class (Do Not Modify): Inpatient [101]  PT Acc Code (Do Not Modify): Private [1]       Medical History Past Medical History:  Diagnosis Date  . Hypertension     Allergies No Known Allergies  IV Location/Drains/Wounds Patient Lines/Drains/Airways Status   Active Line/Drains/Airways    Name:   Placement date:   Placement time:   Site:   Days:   Peripheral IV 01/31/18 Left Arm   01/31/18    0547    Arm   less than 1          Labs/Imaging Results for orders placed or performed during the hospital encounter of 01/31/18 (from the past 48 hour(s))  CBC with Differential     Status: Abnormal   Collection Time: 01/31/18  5:44 AM  Result Value Ref Range   WBC 10.4 4.0 - 10.5 K/uL   RBC 3.53 (L) 4.22 - 5.81 MIL/uL   Hemoglobin 10.9 (L) 13.0 - 17.0 g/dL   HCT 31.7 (L) 39.0 - 52.0 %   MCV 89.8 78.0 - 100.0 fL   MCH 30.9 26.0 - 34.0 pg   MCHC 34.4 30.0 - 36.0 g/dL   RDW 18.2 (H) 11.5 - 15.5 %   Platelets 405 (H) 150 - 400 K/uL   Neutrophils Relative % 74 %   Lymphocytes Relative 16 %   Monocytes Relative 9 %   Eosinophils  Relative 1 %   Basophils Relative 0 %   Neutro Abs 7.7 1.7 - 7.7 K/uL   Lymphs Abs 1.7 0.7 - 4.0 K/uL   Monocytes Absolute 0.9 0.1 - 1.0 K/uL   Eosinophils Absolute 0.1 0.0 - 0.7 K/uL   Basophils Absolute 0.0 0.0 - 0.1 K/uL   RBC Morphology TARGET CELLS     Comment: Performed at [facility], [place] [address] [place], [place] [address]  Comprehensive metabolic panel     Status: Abnormal   Collection Time: 01/31/18  5:44 AM  Result Value Ref Range   Sodium 134 (L) 135 - 145 mmol/L   Potassium 3.2 (L) 3.5 - 5.1 mmol/L   Chloride 101 101 - 111 mmol/L   CO2 23 22 - 32 mmol/L   Glucose, Bld 93 65 - 99 mg/dL   BUN 6 6 - 20 mg/dL   Creatinine, Ser 0.40 (L) 0.61 - 1.24 mg/dL   Calcium 9.3 8.9 - 10.3 mg/dL   Total Protein 7.0 6.5 - 8.1 g/dL   Albumin 2.5 (L) 3.5 - 5.0 g/dL   AST 139 (H) 15 -  41 U/L   ALT 144 (H) 17 - 63 U/L   Alkaline Phosphatase 525 (H) 38 - 126 U/L   Total Bilirubin 25.5 (HH) 0.3 - 1.2 mg/dL    Comment: CRITICAL RESULT CALLED TO, READ BACK BY AND VERIFIED WITH: FRICKEY,J. RN AT 0488 01/31/18 MULLINS,T    GFR calc non Af Amer >60 >60 mL/min   GFR calc Af Amer >60 >60 mL/min    Comment: (NOTE) The eGFR has been calculated using the CKD EPI equation. This calculation has not been validated in all clinical situations. eGFR's persistently <60 mL/min signify possible Chronic Kidney Disease.    Anion gap 10 5 - 15    Comment: Performed at [facility], [place] [address] [place], [place] [address]  Lipase, blood     Status: None   Collection Time: 01/31/18  5:44 AM  Result Value Ref Range   Lipase 41 11 - 51 U/L    Comment: Performed at [facility], [place] [address], [place], [place] [address]  Protime-INR     Status: None   Collection Time: 01/31/18  5:44 AM  Result Value Ref Range   Prothrombin Time 12.8 11.4 - 15.2 seconds   INR 0.97     Comment: Performed at [facility], [place] [address], [place], [place] [address]  Ethanol     Status: None   Collection Time: 01/31/18  5:44 AM  Result Value Ref Range   Alcohol, Ethyl (B) <10 <10 mg/dL    Comment:        LOWEST DETECTABLE LIMIT FOR SERUM ALCOHOL IS 10 mg/dL FOR MEDICAL PURPOSES ONLY Performed at [place] [address], [place], [place] [address]   I-Stat CG4 Lactic Acid, ED     Status: None   Collection Time: 01/31/18  5:55 AM  Result Value Ref Range   Lactic Acid, Venous 0.65 0.5 - 1.9 mmol/L  CBC     Status: Abnormal   Collection Time: 01/31/18  9:56 AM  Result Value Ref Range   WBC 10.5 4.0 - 10.5 K/uL   RBC 3.32 (L) 4.22 - 5.81 MIL/uL   Hemoglobin 10.2 (L) 13.0 - 17.0 g/dL   HCT 29.6 (L) 39.0 - 52.0 %   MCV 89.2 78.0 - 100.0 fL   MCH 30.7 26.0 - 34.0 pg   MCHC 34.5 30.0 - 36.0 g/dL   RDW 18.2 (H) 11.5 - 15.5 %   Platelets 387 150 - 400 K/uL    Comment: Performed at [facility], [place] [address], [place], [place] [address]  Creatinine, serum     Status: Abnormal   Collection Time: 01/31/18  9:56 AM  Result Value Ref Range   Creatinine, Ser 0.37 (L) 0.61 - 1.24 mg/dL   GFR calc non Af Amer >60 >60 mL/min   GFR calc Af Amer >60 >60 mL/min    Comment: (NOTE) The eGFR has been calculated using the CKD EPI equation. This calculation has not been validated in all clinical situations. eGFR's persistently <60 mL/min signify possible Chronic Kidney Disease. Performed at [facility], [place] [address] [place], [place] [address]   Rapid urine drug screen (hospital performed)     Status: Abnormal   Collection Time: 01/31/18 10:52 AM  Result Value Ref Range   Opiates NONE DETECTED NONE DETECTED   Cocaine POSITIVE (A) NONE DETECTED   Benzodiazepines NONE DETECTED NONE DETECTED   Amphetamines NONE DETECTED NONE DETECTED  Tetrahydrocannabinol NONE DETECTED NONE DETECTED   Barbiturates NONE DETECTED NONE DETECTED    Comment: (NOTE) DRUG SCREEN FOR  MEDICAL PURPOSES ONLY.  IF CONFIRMATION IS NEEDED FOR ANY PURPOSE, NOTIFY LAB WITHIN 5 DAYS. LOWEST DETECTABLE LIMITS FOR URINE DRUG SCREEN Drug Class                     Cutoff (ng/mL) Amphetamine and metabolites    1000 Barbiturate and metabolites    200 Benzodiazepine                 357 Tricyclics and metabolites     300 Opiates and metabolites        300 Cocaine and metabolites        300 THC                            50 Performed at [facility], [place] [address] [place], [place] [address]   Urinalysis, Routine w reflex microscopic     Status: Abnormal   Collection Time: 01/31/18 10:52 AM  Result Value Ref Range   Color, Urine AMBER (A) YELLOW    Comment: BIOCHEMICALS MAY BE AFFECTED BY COLOR   APPearance CLEAR CLEAR   Specific Gravity, Urine 1.012 1.005 - 1.030   pH 6.0 5.0 - 8.0   Glucose, UA NEGATIVE NEGATIVE mg/dL   Hgb urine dipstick NEGATIVE NEGATIVE   Bilirubin Urine MODERATE (A) NEGATIVE   Ketones, ur NEGATIVE NEGATIVE mg/dL   Protein, ur NEGATIVE NEGATIVE mg/dL   Nitrite NEGATIVE NEGATIVE   Leukocytes, UA NEGATIVE NEGATIVE    Comment: Performed at [facility], [place] [address], [place], [place] [address]   US Abdomen Complete  Result Date: 01/31/2018 CLINICAL DATA:  Jaundiced, nauseous EXAM: ABDOMEN ULTRASOUND COMPLETE COMPARISON:  None. FINDINGS: Gallbladder: No cholelithiasis. Gallbladder sludge is present. Severe gallbladder wall thickening measuring up to 10 mm. Trace pericholecystic fluid. Negative sonographic Murphy sign. Common bile duct: Diameter: 10.2 mm Liver: No focal lesion identified. Within normal limits in parenchymal echogenicity. Moderate intrahepatic biliary ductal dilatation in the right and left hepatic lobes. Portal vein is patent on color Doppler imaging with normal direction of blood flow towards the liver. IVC: No abnormality visualized. Pancreas: Visualized portion unremarkable. Spleen: Size and  appearance within normal limits. Right Kidney: Length: 11.2 cm. Echogenicity within normal limits. No mass or hydronephrosis visualized. Left Kidney: Length: 11.2 cm. 1.2 x 1.6 x 1.1 cm anechoic left renal mass most consistent with a cyst. Echogenicity within normal limits. No solid mass or hydronephrosis visualized. Abdominal aorta: No aneurysm visualized. Other findings: None. IMPRESSION: 1. Dilated common bile duct with intrahepatic biliary ductal dilatation of uncertain etiology. No obstructing mass or choledocholithiasis is identified. Recommend further evaluation with MR abdomen/MRCP. 2. Severe gallbladder wall thickening measuring up to 10 mm with a trace amount of pericholecystic fluid without cholelithiasis. This appearance can be seen in the setting of a calculus cholecystitis. Electronically Signed   By: Kathreen Devoid   On: 01/31/2018 09:33    Pending Labs Unresulted Labs (From admission, onward)   Start     Ordered   02/01/18 0500  Comprehensive metabolic panel  Daily,   R     01/31/18 0803   02/01/18 0500  CBC  Tomorrow morning,   R     01/31/18 0803   02/01/18 0500  Protime-INR  Tomorrow morning,   R     01/31/18  0803   02/01/18 0500  APTT  Tomorrow morning,   R     01/31/18 0803   01/31/18 0804  Culture, blood (routine x 2)  BLOOD CULTURE X 2,   R     01/31/18 0803   01/31/18 0523  Hepatitis panel, acute  STAT,   STAT     01/31/18 0522   01/31/18 0523  HIV antibody  Once,   STAT     01/31/18 0522      Vitals/Pain Today's Vitals   01/31/18 0509 01/31/18 0629 01/31/18 0900 01/31/18 0953  BP:  (!) 142/81 128/81 (!) 136/91  Pulse:  71 61 65  Resp:  18  18  Temp:      TempSrc:      SpO2:  99% 96% 96%  PainSc: 5        Isolation Precautions No active isolations  Medications Medications  heparin injection 5,000 Units (has no administration in time range)  dextrose 5 %-0.45 % sodium chloride infusion ( Intravenous New Bag/Given 01/31/18 1050)  HYDROcodone-acetaminophen  (NORCO/VICODIN) 5-325 MG per tablet 1-2 tablet (has no administration in time range)  senna-docusate (Senokot-S) tablet 1 tablet (has no administration in time range)  insulin aspart (novoLOG) injection 0-9 Units (has no administration in time range)  LORazepam (ATIVAN) tablet 1 mg (has no administration in time range)    Or  LORazepam (ATIVAN) injection 1 mg (has no administration in time range)  thiamine (VITAMIN B-1) tablet 100 mg (100 mg Oral Given 01/31/18 1049)    Or  thiamine (B-1) injection 100 mg ( Intravenous See Alternative 02/04/48 8264)  folic acid (FOLVITE) tablet 1 mg (1 mg Oral Given 01/31/18 1049)  multivitamin with minerals tablet 1 tablet (1 tablet Oral Given 01/31/18 1049)  loratadine (CLARITIN) tablet 10 mg (has no administration in time range)  lip balm (CARMEX) ointment 1 application (has no administration in time range)  guaiFENesin-dextromethorphan (ROBITUSSIN DM) 100-10 MG/5ML syrup 5 mL (has no administration in time range)  polyvinyl alcohol (LIQUIFILM TEARS) 1.4 % ophthalmic solution 1 drop (has no administration in time range)  hydrocortisone (ANUSOL-HC) 2.5 % rectal cream 1 application (has no administration in time range)  alum & mag hydroxide-simeth (MAALOX/MYLANTA) 200-200-20 MG/5ML suspension 30 mL (has no administration in time range)  hydrocortisone cream 1 % 1 application (has no administration in time range)  MUSCLE RUB CREA 1 application (has no administration in time range)  sodium chloride (OCEAN) 0.65 % nasal spray 1 spray (has no administration in time range)  phenol (CHLORASEPTIC) mouth spray 1 spray (has no administration in time range)  iopamidol (ISOVUE-300) 61 % injection 30 mL (has no administration in time range)  piperacillin-tazobactam (ZOSYN) IVPB 3.375 g (has no administration in time range)  sodium chloride 0.9 % bolus 1,000 mL (0 mLs Intravenous Stopped 01/31/18 0850)  piperacillin-tazobactam (ZOSYN) IVPB 3.375 g (0 g Intravenous Stopped  01/31/18 1132)  iopamidol (ISOVUE-300) 61 % injection (30 mLs Oral Contrast Given 01/31/18 0958)    Mobility walks

## 2018-02-01 DIAGNOSIS — R634 Abnormal weight loss: Secondary | ICD-10-CM

## 2018-02-01 DIAGNOSIS — K838 Other specified diseases of biliary tract: Secondary | ICD-10-CM

## 2018-02-01 DIAGNOSIS — K831 Obstruction of bile duct: Principal | ICD-10-CM

## 2018-02-01 LAB — PROTIME-INR
INR: 1.16
PROTHROMBIN TIME: 14.7 s (ref 11.4–15.2)

## 2018-02-01 LAB — COMPREHENSIVE METABOLIC PANEL
ALBUMIN: 1.9 g/dL — AB (ref 3.5–5.0)
ALK PHOS: 438 U/L — AB (ref 38–126)
ALT: 118 U/L — ABNORMAL HIGH (ref 17–63)
ANION GAP: 9 (ref 5–15)
AST: 136 U/L — AB (ref 15–41)
BILIRUBIN TOTAL: 19.8 mg/dL — AB (ref 0.3–1.2)
BUN: 10 mg/dL (ref 6–20)
CALCIUM: 8.5 mg/dL — AB (ref 8.9–10.3)
CO2: 24 mmol/L (ref 22–32)
Chloride: 102 mmol/L (ref 101–111)
Creatinine, Ser: 0.62 mg/dL (ref 0.61–1.24)
GFR calc Af Amer: >60 mL/min (ref >60)
GFR calc non Af Amer: >60 mL/min (ref >60)
GLUCOSE: 99 mg/dL (ref 65–99)
Potassium: 3.4 mmol/L — ABNORMAL LOW (ref 3.5–5.1)
Sodium: 135 mmol/L (ref 135–145)
TOTAL PROTEIN: 5.8 g/dL — AB (ref 6.5–8.1)

## 2018-02-01 LAB — HEPATITIS PANEL, ACUTE
HCV Ab: <0.1 s/co ratio (ref 0.0–0.9)
HEP A IGM: NEGATIVE
Hep B C IgM: NEGATIVE
Hepatitis B Surface Ag: NEGATIVE

## 2018-02-01 LAB — CBC
HCT: 28.5 % — ABNORMAL LOW (ref 39.0–52.0)
Hemoglobin: 9.6 g/dL — ABNORMAL LOW (ref 13.0–17.0)
MCH: 29.9 pg (ref 26.0–34.0)
MCHC: 33.7 g/dL (ref 30.0–36.0)
MCV: 88.8 fL (ref 78.0–100.0)
Platelets: 401 10*3/uL — ABNORMAL HIGH (ref 150–400)
RBC: 3.21 MIL/uL — ABNORMAL LOW (ref 4.22–5.81)
RDW: 19.6 % — AB (ref 11.5–15.5)
WBC: 9.4 10*3/uL (ref 4.0–10.5)

## 2018-02-01 LAB — GLUCOSE, CAPILLARY
Glucose-Capillary: 78 mg/dL (ref 65–99)
Glucose-Capillary: 106 mg/dL — ABNORMAL HIGH (ref 65–99)
Glucose-Capillary: 91 mg/dL (ref 65–99)
Glucose-Capillary: 129 mg/dL — ABNORMAL HIGH (ref 65–99)

## 2018-02-01 LAB — APTT: APTT: 31 s (ref 24–36)

## 2018-02-01 MED ORDER — POTASSIUM CHLORIDE CRYS ER 20 MEQ PO TBCR
40.0000 meq | EXTENDED_RELEASE_TABLET | Freq: Once | ORAL | Status: AC
  Administered 2018-02-01: 40 meq via ORAL
  Filled 2018-02-01: qty 2

## 2018-02-01 MED ORDER — INDOMETHACIN 50 MG RE SUPP
100.0000 mg | Freq: Once | RECTAL | Status: AC
  Administered 2018-02-02: 100 mg via RECTAL

## 2018-02-01 NOTE — H&P (View-Only) (Signed)
[facility] Progress Note  Chief Complaint: Obstructive jaundice  History:  [name] feels much the same as yesterday, with right upper quadrant abdominal pain and nausea.  He is able to tolerate regular food without vomiting.  There is been no fever or chills.  ROS: Cardiovascular:  no chest pain Respiratory: no dyspnea  Objective:  Med list reviewed  Vital signs in last 24 hrs: Vitals:   02/01/18 0525 02/01/18 1153  BP: 134/84 137/76  Pulse: 71 70  Resp: 17 18  Temp: 99.3 F (37.4 C) 99.2 F (37.3 C)  SpO2: 97% 98%    Physical Exam   HEENT: sclera deeply icteric, oral mucosa moist without lesions  Neck: supple, no thyromegaly, JVD or lymphadenopathy  Cardiac: RRR without murmurs, S1S2 heard, no peripheral edema  Pulm: clear to auscultation bilaterally, normal RR and effort noted  Abdomen: soft, RUQ tenderness, with active bowel sounds. No guarding or palpable hepatosplenomegaly Skin; warm and dry, no jaundice or rash.  Jaundiced  Recent Labs:  Recent Labs  Lab 01/31/18 0544 01/31/18 0956 02/01/18 0453  WBC 10.4 10.5 9.4  HGB 10.9* 10.2* 9.6*  HCT 31.7* 29.6* 28.5*  PLT 405* 387 401*   Recent Labs  Lab 02/01/18 0453  NA 135  K 3.4*  CL 102  CO2 24  BUN 10  ALBUMIN 1.9*  ALKPHOS 438*  ALT 118*  AST 136*  GLUCOSE 99   Recent Labs  Lab 02/01/18 0453  INR 1.16    Radiologic studies:  CT abdomen pelvis with contrast was personally reviewed.  There is a high-grade malignant appearing mass in the common hepatic duct probably involving or arising from the gallbladder at the porta hepatis.  There is some adenopathy in that area as well.  Marked intrahepatic biliary ductal dilatation is seen.  The pancreas appears normal . no ascites.  Diffuse gallbladder wall thickening with calcification.  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 Weight loss Several days of upper abdominal pain nausea.  No fever or leukocytosis.  LFTs are somewhat improved from yesterday.  This is a very worrisome appearance for malignancy.  Plan: CA 19-9 with tomorrow morning labs Continue Zosyn in case there is low-grade biliary infection from this obstruction ERCP, tentatively scheduled for tomorrow.  I described this procedure in detail with him including diagrams of the anatomy.  The plan would be for brushings of the stricture and hopefully plastic stent placement, thereby relieving the obstruction and allowing him to move on with further diagnostic testing and formulation of treatment plan.  He does not live in this area, so the timing and logistics of all that are not yet clear. He is agreeable to ERCP.  The benefits and risks of the planned procedure were described in detail with the patient or (when appropriate) their health care proxy.  Risks were outlined as including, but not limited to, bleeding, infection, perforation, adverse medication reaction leading to cardiac or pulmonary decompensation, or pancreatitis (if ERCP).  The limitation of incomplete mucosal visualization was also discussed.  No guarantees or warranties were given. Patient at increased risk for cardiopulmonary complications of procedure due to medical comorbidities.   [name] Pager [phone] Mon-Fri 8a-5p [phone] after 5p, weekends, holidays

## 2018-02-01 NOTE — Progress Notes (Signed)
PROGRESS NOTE [facility]   Leroy Harrell   IPJ:[id number] DOB: 1961/08/17  DOA: 01/31/2018 PCP: System, Pcp Not In   Brief Narrative:  Leroy Harrell is a 57 year old male with significant history of hypertension, alcohol and tobacco abuse who presented to the emergency department complaining of abdominal pain and fatigue, associated with jaundice and dark urine.  Upon initial evaluation patient did not appear to be in acute distress but laboratory finding shows that TBD of 25.5, AST up to 139, ALT 144, ALP 525 with normal INR and PTT.  Alcohol levels were negative.  Right upper quadrant ultrasound showed dilated common bile duct with intrahepatic biliary ductal dilation of unclear etiology.  Wall thickening severe gallbladder measuring up to 10 mm without cholelithiasis.  Patient was admitted with working diagnosis of acute alcoholic hepatitis versus acute cholecystitis.  GI was consulted recommended CT of the abdomen which showed intrahepatic biliary dilation with cord of the common bile duct there is irregular gallbladder wall thickening with calcification finding concern of malignancy ?  Cholangiocarcinoma.  Subjective: Patient seen and examined, report mild right upper quadrant pain but able to tolerate fluids. Urine is dark.  No other concerns.  No acute events overnight  Assessment & Plan: Obstructive jaundice Concern for phalangeal carcinoma versus gallbladder carcinoma Patient report weight loss 30 pounds in about 2-3 months. T bili significantly elevated, transaminitis improving GI was consulted and recommending ERCP. Patient was started on Zosyn and concern of probably elderly cholecystitis attributing to acute symptoms. Will continue antibiotics for now Patient is from [facility], he does not see a physician regularly.  Will need to control symptoms and establish a diagnosis in order to get him to [facility] for further workup and outpatient  treatment  History of hypertension Noncompliant with medication for a few years BP stable for now we will continue to monitor  Hypokalemia Replete Check BMP and magnesium in a.m.  Alcohol abuse Patient placed on CIWA protocol No signs of alcohol withdrawal, alcohol levels normal  DVT prophylaxis: Subcutaneous heparin Code Status: Full code Family Communication: None at bedside Disposition Plan: Home when established diagnosis and symptom control  Consultants:   GI -Dr. Loletha Carrow  Procedures:   None  Antimicrobials:  Zosyn   Objective: Vitals:   01/31/18 1253 01/31/18 2127 02/01/18 0525 02/01/18 1153  BP:  125/90 134/84 137/76  Pulse:  91 71 70  Resp:  17 17 18   Temp:  99.9 F (37.7 C) 99.3 F (37.4 C) 99.2 F (37.3 C)  TempSrc:  Oral Oral Oral  SpO2:  96% 97% 98%  Weight: 65.7 kg (144 lb 13.5 oz)     Height: 5\' 7"  (1.702 m)       Intake/Output Summary (Last 24 hours) at 02/01/2018 1654 Last data filed at 02/01/2018 1420 Gross per 24 hour  Intake 2870 ml  Output 1075 ml  Net 1795 ml   Filed Weights   01/31/18 1253  Weight: 65.7 kg (144 lb 13.5 oz)    Examination:  General exam: NAD, jaundice HEENT: Scleral icterus, OP moist, no LAD Respiratory system: Clear to auscultation. No wheezes,crackle or rhonchi Cardiovascular system: S1 & S2 heard, RRR. No JVD, murmurs, rubs or gallops Gastrointestinal system: Abd soft, RUQ tenderness, + bowel sounds no guarding, no palpable, no organomegaly Central nervous system: Alert and oriented. No focal neurological deficits. Extremities: No pedal edema. Symmetric, strength 5/5   Skin: No rashes jaundice Psychiatry: Judgement and insight appear normal. Mood & affect appropriate.  Data Reviewed: I have personally reviewed following labs and imaging studies  CBC: Recent Labs  Lab 01/31/18 0544 01/31/18 0956 02/01/18 0453  WBC 10.4 10.5 9.4  NEUTROABS 7.7  --   --   HGB 10.9* 10.2* 9.6*  HCT 31.7* 29.6* 28.5*   MCV 89.8 89.2 88.8  PLT 405* 387 161*   Basic Metabolic Panel: Recent Labs  Lab 01/31/18 0544 01/31/18 0956 02/01/18 0453  NA 134*  --  135  K 3.2*  --  3.4*  CL 101  --  102  CO2 23  --  24  GLUCOSE 93  --  99  BUN 6  --  10  CREATININE 0.40* 0.37* 0.62  CALCIUM 9.3  --  8.5*   GFR: Estimated Creatinine Clearance: 95.8 mL/min (by C-G formula based on SCr of 0.62 mg/dL). Liver Function Tests: Recent Labs  Lab 01/31/18 0544 02/01/18 0453  AST 139* 136*  ALT 144* 118*  ALKPHOS 525* 438*  BILITOT 25.5* 19.8*  PROT 7.0 5.8*  ALBUMIN 2.5* 1.9*   Recent Labs  Lab 01/31/18 0544  LIPASE 41   No results for input(s): AMMONIA in the last 168 hours. Coagulation Profile: Recent Labs  Lab 01/31/18 0544 02/01/18 0453  INR 0.97 1.16   Cardiac Enzymes: No results for input(s): CKTOTAL, CKMB, CKMBINDEX, TROPONINI in the last 168 hours. BNP (last 3 results) No results for input(s): PROBNP in the last 8760 hours. HbA1C: No results for input(s): HGBA1C in the last 72 hours. CBG: Recent Labs  Lab 01/31/18 1240 01/31/18 1732 01/31/18 2131 02/01/18 0523 02/01/18 1129  GLUCAP 66 113* 121* 78 106*   Lipid Profile: No results for input(s): CHOL, HDL, LDLCALC, TRIG, CHOLHDL, LDLDIRECT in the last 72 hours. Thyroid Function Tests: No results for input(s): TSH, T4TOTAL, FREET4, T3FREE, THYROIDAB in the last 72 hours. Anemia Panel: No results for input(s): VITAMINB12, FOLATE, FERRITIN, TIBC, IRON, RETICCTPCT in the last 72 hours. Sepsis Labs: Recent Labs  Lab 01/31/18 0555  LATICACIDVEN 0.65    Recent Results (from the past 240 hour(s))  Culture, blood (routine x 2)     Status: None (Preliminary result)   Collection Time: 01/31/18  9:57 AM  Result Value Ref Range Status   Specimen Description   Final    BLOOD LEFT FOREARM Performed at [facility] [address] [place], [place] [address]    Special Requests   Final    BOTTLES DRAWN AEROBIC  AND ANAEROBIC Blood Culture adequate volume Performed at [facility] [address] [place], [place] [address]    Culture   Final    NO GROWTH 1 DAY Performed at [facility], [place] [address], [place], [place] [address]    Report Status PENDING  Incomplete  Culture, blood (routine x 2)     Status: None (Preliminary result)   Collection Time: 01/31/18 10:06 AM  Result Value Ref Range Status   Specimen Description   Final    BLOOD LEFT HAND Performed at [facility] [address], [place], [place] [address]    Special Requests   Final    BOTTLES DRAWN AEROBIC AND ANAEROBIC Blood Culture adequate volume Performed at [facility] [address], [place], [place] [address]    Culture   Final    NO GROWTH 1 DAY Performed at [facility], [place] [address] [place], [place] [address]    Report Status PENDING  Incomplete  Radiology Studies: US Abdomen Complete  Result Date: 01/31/2018 CLINICAL DATA:  Jaundiced, nauseous EXAM: ABDOMEN ULTRASOUND COMPLETE COMPARISON:  None. FINDINGS: Gallbladder: No cholelithiasis. Gallbladder sludge is present. Severe gallbladder wall thickening measuring up to 10 mm. Trace pericholecystic fluid. Negative sonographic Murphy sign. Common bile duct: Diameter: 10.2 mm Liver: No focal lesion identified. Within normal limits in parenchymal echogenicity. Moderate intrahepatic biliary ductal dilatation in the right and left hepatic lobes. Portal vein is patent on color Doppler imaging with normal direction of blood flow towards the liver. IVC: No abnormality visualized. Pancreas: Visualized portion unremarkable. Spleen: Size and appearance within normal limits. Right Kidney: Length: 11.2 cm. Echogenicity within normal limits. No mass or hydronephrosis visualized. Left Kidney: Length: 11.2 cm. 1.2 x 1.6 x 1.1 cm anechoic left renal mass most consistent with a cyst. Echogenicity within normal  limits. No solid mass or hydronephrosis visualized. Abdominal aorta: No aneurysm visualized. Other findings: None. IMPRESSION: 1. Dilated common bile duct with intrahepatic biliary ductal dilatation of uncertain etiology. No obstructing mass or choledocholithiasis is identified. Recommend further evaluation with MR abdomen/MRCP. 2. Severe gallbladder wall thickening measuring up to 10 mm with a trace amount of pericholecystic fluid without cholelithiasis. This appearance can be seen in the setting of a calculus cholecystitis. Electronically Signed   By: Kathreen Devoid   On: 01/31/2018 09:33   Ct Abdomen Pelvis W Contrast  Result Date: 01/31/2018 CLINICAL DATA:  Abdominal pain and fatigue with biliary dilatation and gallbladder wall thickening ultrasound. Jaundice. EXAM: CT ABDOMEN AND PELVIS WITH CONTRAST TECHNIQUE: Multidetector CT imaging of the abdomen and pelvis was performed using the standard protocol following bolus administration of intravenous contrast. CONTRAST:  160mL ISOVUE-300 IOPAMIDOL (ISOVUE-300) INJECTION 61% COMPARISON:  Ultrasound 01/31/2018. FINDINGS: Lower chest: Mild dependent atelectasis at both lung bases. No significant pleural or pericardial effusion. Hepatobiliary: As demonstrated on ultrasound, there is diffuse intrahepatic biliary dilatation which is moderate to severe. The common hepatic duct measures up to 12 mm in diameter (coronal image 36/5). There is an abrupt cut off the common bile duct in the porta hepatis, also best seen on the reformatted images. No well-defined mass is seen in this area. The intrapancreatic portion of the common bile duct is normal in caliber. There is diffuse gallbladder wall thickening with wall calcification in the fundal region. There is ill-defined low-density in the liver surrounding the gallbladder. No well-defined gallbladder mass identified. The liver otherwise appears unremarkable. Pancreas: No evidence of pancreatic mass or pancreatic ductal  dilatation. No surrounding inflammation. Spleen: Normal in size without focal abnormality. Adrenals/Urinary Tract: Both adrenal glands appear normal. There is a 12 mm cyst in the interpolar region of the left kidney. Anteriorly in the mid right kidney, there is a wedge-shaped area of decreased attenuation on the delayed images (image 19/7). This could reflect an area of inflammation or infarction. No evidence of urinary tract calculus or hydronephrosis. The bladder appears normal. Stomach/Bowel: No evidence of bowel wall thickening, distention or surrounding inflammatory change. The appendix appears normal. Vascular/Lymphatic: Approximately 15 mm celiac node on image 20/2. There are additional smaller nodes in the porta hepatis. No retroperitoneal or pelvic adenopathy. Mild aortic and branch vessel atherosclerosis. The portal, superior mesenteric and splenic veins are patent. Reproductive: Central dystrophic calcifications within the prostate gland. Other: Trace perihepatic ascites. No peritoneal nodularity. The anterior abdominal wall is intact. Musculoskeletal: No acute or significant osseous findings. IMPRESSION: 1. As demonstrated on earlier study, there is moderate to severe intrahepatic biliary dilatation with abrupt cut  off of the common bile duct in the porta hepatis. There is irregular gallbladder wall thickening, wall calcification and surrounding low-density in the liver. These findings are highly concerning for malignancy, likely cholangiocarcinoma. Gallbladder carcinoma would be an additional consideration. At least 1 enlarged celiac node, suspicious for nodal metastasis. 2. ERCP for tissue sampling and biliary decompression recommended. 3. No evidence of distant metastasis. 4. Possible small infarct or area of inflammation anteriorly in the interpolar region of the right kidney. 5.  Aortic Atherosclerosis (ICD10-I70.0). Electronically Signed   By: Richardean Sale M.D.   On: 01/31/2018 12:35       Scheduled Meds: . feeding supplement (ENSURE ENLIVE)  237 mL Oral TID BM  . folic acid  1 mg Oral Daily  . heparin  5,000 Units Subcutaneous Q8H  . [START ON 02/02/2018] indomethacin  100 mg Rectal Once  . insulin aspart  0-9 Units Subcutaneous TID WC  . iopamidol  30 mL Oral Once  . multivitamin with minerals  1 tablet Oral Daily  . thiamine  100 mg Oral Daily   Or  . thiamine  100 mg Intravenous Daily   Continuous Infusions: . piperacillin-tazobactam (ZOSYN)  IV Stopped (02/01/18 1420)     LOS: 1 day    Time spent: Total of 35 minutes spent with pt, greater than 50% of which was spent in discussion of  treatment, counseling and coordination of care  Chipper Oman, MD Pager: Text Page via [email or web address]   If 7PM-7AM, please contact night-coverage [email or web address] 02/01/2018, 4:54 PM   Note - This record has been created using [employer]. Chart creation errors have been sought, but may not always have been located. Such creation errors do not reflect on the standard of medical care.

## 2018-02-01 NOTE — Progress Notes (Signed)
[facility] Progress Note  Chief Complaint: Obstructive jaundice  History:  [name] feels much the same as yesterday, with right upper quadrant abdominal pain and nausea.  He is able to tolerate regular food without vomiting.  There is been no fever or chills.  ROS: Cardiovascular:  no chest pain Respiratory: no dyspnea  Objective:  Med list reviewed  Vital signs in last 24 hrs: Vitals:   02/01/18 0525 02/01/18 1153  BP: 134/84 137/76  Pulse: 71 70  Resp: 17 18  Temp: 99.3 F (37.4 C) 99.2 F (37.3 C)  SpO2: 97% 98%    Physical Exam   HEENT: sclera deeply icteric, oral mucosa moist without lesions  Neck: supple, no thyromegaly, JVD or lymphadenopathy  Cardiac: RRR without murmurs, S1S2 heard, no peripheral edema  Pulm: clear to auscultation bilaterally, normal RR and effort noted  Abdomen: soft, RUQ tenderness, with active bowel sounds. No guarding or palpable hepatosplenomegaly Skin; warm and dry, no jaundice or rash.  Jaundiced  Recent Labs:  Recent Labs  Lab 01/31/18 0544 01/31/18 0956 02/01/18 0453  WBC 10.4 10.5 9.4  HGB 10.9* 10.2* 9.6*  HCT 31.7* 29.6* 28.5*  PLT 405* 387 401*   Recent Labs  Lab 02/01/18 0453  NA 135  K 3.4*  CL 102  CO2 24  BUN 10  ALBUMIN 1.9*  ALKPHOS 438*  ALT 118*  AST 136*  GLUCOSE 99   Recent Labs  Lab 02/01/18 0453  INR 1.16    Radiologic studies:  CT abdomen pelvis with contrast was personally reviewed.  There is a high-grade malignant appearing mass in the common hepatic duct probably involving or arising from the gallbladder at the porta hepatis.  There is some adenopathy in that area as well.  Marked intrahepatic biliary ductal dilatation is seen.  The pancreas appears normal . no ascites.  Diffuse gallbladder wall thickening with calcification.  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 Weight loss Several days of upper abdominal pain nausea.  No fever or leukocytosis.  LFTs are somewhat improved from yesterday.  This is a very worrisome appearance for malignancy.  Plan: CA 19-9 with tomorrow morning labs Continue Zosyn in case there is low-grade biliary infection from this obstruction ERCP, tentatively scheduled for tomorrow.  I described this procedure in detail with him including diagrams of the anatomy.  The plan would be for brushings of the stricture and hopefully plastic stent placement, thereby relieving the obstruction and allowing him to move on with further diagnostic testing and formulation of treatment plan.  He does not live in this area, so the timing and logistics of all that are not yet clear. He is agreeable to ERCP.  The benefits and risks of the planned procedure were described in detail with the patient or (when appropriate) their health care proxy.  Risks were outlined as including, but not limited to, bleeding, infection, perforation, adverse medication reaction leading to cardiac or pulmonary decompensation, or pancreatitis (if ERCP).  The limitation of incomplete mucosal visualization was also discussed.  No guarantees or warranties were given. Patient at increased risk for cardiopulmonary complications of procedure due to medical comorbidities.   [name] Pager [phone] Mon-Fri 8a-5p [phone] after 5p, weekends, holidays

## 2018-02-02 ENCOUNTER — Inpatient Hospital Stay (HOSPITAL_COMMUNITY): Payer: Self-pay | Admitting: Anesthesiology

## 2018-02-02 ENCOUNTER — Encounter (HOSPITAL_COMMUNITY)
Admission: EM | Disposition: A | Discharge status: Home / Self Care | Payer: Self-pay | Source: Home / Self Care | Attending: Family Medicine

## 2018-02-02 ENCOUNTER — Encounter (HOSPITAL_COMMUNITY): Payer: Self-pay | Admitting: *Deleted

## 2018-02-02 ENCOUNTER — Inpatient Hospital Stay (HOSPITAL_COMMUNITY): Payer: Self-pay

## 2018-02-02 HISTORY — PX: ERCP: SHX5425

## 2018-02-02 LAB — COMPREHENSIVE METABOLIC PANEL
ALBUMIN: 2 g/dL — AB (ref 3.5–5.0)
ALT: 131 U/L — AB (ref 17–63)
AST: 141 U/L — ABNORMAL HIGH (ref 15–41)
Alkaline Phosphatase: 445 U/L — ABNORMAL HIGH (ref 38–126)
Anion gap: 9 (ref 5–15)
BUN: 7 mg/dL (ref 6–20)
CHLORIDE: 102 mmol/L (ref 101–111)
CO2: 25 mmol/L (ref 22–32)
Calcium: 8.9 mg/dL (ref 8.9–10.3)
Creatinine, Ser: 0.53 mg/dL — ABNORMAL LOW (ref 0.61–1.24)
GFR calc Af Amer: >60 mL/min (ref >60)
GLUCOSE: 95 mg/dL (ref 65–99)
POTASSIUM: 3.6 mmol/L (ref 3.5–5.1)
SODIUM: 136 mmol/L (ref 135–145)
Total Bilirubin: 19.6 mg/dL (ref 0.3–1.2)
Total Protein: 6 g/dL — ABNORMAL LOW (ref 6.5–8.1)

## 2018-02-02 LAB — GLUCOSE, CAPILLARY
GLUCOSE-CAPILLARY: 81 mg/dL (ref 65–99)
Glucose-Capillary: 108 mg/dL — ABNORMAL HIGH (ref 65–99)
Glucose-Capillary: 270 mg/dL — ABNORMAL HIGH (ref 65–99)

## 2018-02-02 LAB — MAGNESIUM: MAGNESIUM: 1.9 mg/dL (ref 1.7–2.4)

## 2018-02-02 SURGERY — ERCP, WITH INTERVENTION IF INDICATED
Anesthesia: General

## 2018-02-02 MED ORDER — SUGAMMADEX SODIUM 200 MG/2ML IV SOLN
INTRAVENOUS | Status: DC | PRN
  Administered 2018-02-02: 150 mg via INTRAVENOUS

## 2018-02-02 MED ORDER — FENTANYL CITRATE (PF) 100 MCG/2ML IJ SOLN: 25.0000 ug | INTRAMUSCULAR | Status: DC | PRN

## 2018-02-02 MED ORDER — ONDANSETRON HCL 4 MG/2ML IJ SOLN
INTRAMUSCULAR | Status: DC | PRN
  Administered 2018-02-02: 4 mg via INTRAVENOUS

## 2018-02-02 MED ORDER — PROPOFOL 10 MG/ML IV BOLUS
INTRAVENOUS | Status: AC
  Filled 2018-02-02: qty 20

## 2018-02-02 MED ORDER — PROPOFOL 10 MG/ML IV BOLUS
INTRAVENOUS | Status: DC | PRN
  Administered 2018-02-02: 200 mg via INTRAVENOUS

## 2018-02-02 MED ORDER — PHENYLEPHRINE HCL 10 MG/ML IJ SOLN
INTRAVENOUS | Status: DC | PRN
  Administered 2018-02-02: 80 ug/min via INTRAVENOUS

## 2018-02-02 MED ORDER — FENTANYL CITRATE (PF) 100 MCG/2ML IJ SOLN
INTRAMUSCULAR | Status: DC | PRN
  Administered 2018-02-02: 50 ug via INTRAVENOUS
  Administered 2018-02-02: 100 ug via INTRAVENOUS

## 2018-02-02 MED ORDER — MIDAZOLAM HCL 2 MG/2ML IJ SOLN
INTRAMUSCULAR | Status: AC
  Filled 2018-02-02: qty 2

## 2018-02-02 MED ORDER — LACTATED RINGERS IV SOLN
INTRAVENOUS | Status: DC | PRN
  Administered 2018-02-02: 12:00:00 via INTRAVENOUS

## 2018-02-02 MED ORDER — LIDOCAINE 2% (20 MG/ML) 5 ML SYRINGE
INTRAMUSCULAR | Status: DC | PRN
  Administered 2018-02-02: 50 mg via INTRAVENOUS

## 2018-02-02 MED ORDER — INDOMETHACIN 50 MG RE SUPP
RECTAL | Status: AC
  Filled 2018-02-02: qty 2

## 2018-02-02 MED ORDER — PHENYLEPHRINE 40 MCG/ML (10ML) SYRINGE FOR IV PUSH (FOR BLOOD PRESSURE SUPPORT)
PREFILLED_SYRINGE | INTRAVENOUS | Status: DC | PRN
  Administered 2018-02-02: 100 ug via INTRAVENOUS
  Administered 2018-02-02: 80 ug via INTRAVENOUS

## 2018-02-02 MED ORDER — DEXAMETHASONE SODIUM PHOSPHATE 10 MG/ML IJ SOLN
INTRAMUSCULAR | Status: DC | PRN
  Administered 2018-02-02: 10 mg via INTRAVENOUS

## 2018-02-02 MED ORDER — ROCURONIUM BROMIDE 10 MG/ML (PF) SYRINGE
PREFILLED_SYRINGE | INTRAVENOUS | Status: DC | PRN
  Administered 2018-02-02: 50 mg via INTRAVENOUS

## 2018-02-02 MED ORDER — GLUCAGON HCL RDNA (DIAGNOSTIC) 1 MG IJ SOLR
INTRAMUSCULAR | Status: AC
  Filled 2018-02-02: qty 1

## 2018-02-02 MED ORDER — FENTANYL CITRATE (PF) 250 MCG/5ML IJ SOLN
INTRAMUSCULAR | Status: AC
  Filled 2018-02-02: qty 5

## 2018-02-02 MED ORDER — MIDAZOLAM HCL 5 MG/5ML IJ SOLN
INTRAMUSCULAR | Status: DC | PRN
  Administered 2018-02-02: 2 mg via INTRAVENOUS

## 2018-02-02 MED ORDER — LACTATED RINGERS IV SOLN: INTRAVENOUS | Status: DC

## 2018-02-02 NOTE — Interval H&P Note (Signed)
History and Physical Interval Note:  02/02/2018 12:02 PM  Leroy Harrell  has presented today for surgery, with the diagnosis of obstructive jaundice  The various methods of treatment have been discussed with the patient and family. After consideration of risks, benefits and other options for treatment, the patient has consented to  Procedure(s): ENDOSCOPIC RETROGRADE CHOLANGIOPANCREATOGRAPHY (ERCP) (N/A) as a surgical intervention .  The patient's history has been reviewed, patient examined, no change in status, stable for surgery.  I have reviewed the patient's chart and labs.  Questions were answered to the patient's satisfaction.    LFTs stable from yesterday.  [name]

## 2018-02-02 NOTE — Anesthesia Postprocedure Evaluation (Signed)
Anesthesia Post Note  Patient: Leroy Harrell  Procedure(s) Performed: ENDOSCOPIC RETROGRADE CHOLANGIOPANCREATOGRAPHY (ERCP) (N/A )     Patient location during evaluation: PACU Anesthesia Type: General Level of consciousness: awake and alert Pain management: pain level controlled Vital Signs Assessment: post-procedure vital signs reviewed and stable Respiratory status: spontaneous breathing, nonlabored ventilation, respiratory function stable and patient connected to nasal cannula oxygen Cardiovascular status: blood pressure returned to baseline and stable Postop Assessment: no apparent nausea or vomiting Anesthetic complications: no    Last Vitals:  Vitals:   02/02/18 1415 02/02/18 1430  BP: 125/76 117/79  Pulse: 65 (!) 51  Resp: 16 14  Temp:    SpO2: 100% 99%    Last Pain:  Vitals:   02/02/18 1430  TempSrc:   PainSc: 0-No pain                 Jhanae Jaskowiak,W. EDMOND

## 2018-02-02 NOTE — Transfer of Care (Signed)
Immediate Anesthesia Transfer of Care Note  Patient: Leroy Harrell  Procedure(s) Performed: Procedure(s): ENDOSCOPIC RETROGRADE CHOLANGIOPANCREATOGRAPHY (ERCP) (N/A)  Patient Location: PACU  Anesthesia Type:General  Level of Consciousness:  sedated, patient cooperative and responds to stimulation  Airway & Oxygen Therapy:Patient Spontanous Breathing and Patient connected to face mask oxgen  Post-op Assessment:  Report given to PACU RN and Post -op Vital signs reviewed and stable  Post vital signs:  Reviewed and stable  Last Vitals:  Vitals:   02/01/18 2317 02/02/18 1140  BP: 132/72 (!) 149/92  Pulse: 77   Resp: 18 18  Temp: 37.5 C 37.4 C  SpO2: 54% 00%    Complications: No apparent anesthesia complications

## 2018-02-02 NOTE — Anesthesia Preprocedure Evaluation (Addendum)
Anesthesia Evaluation  Patient identified by MRN, date of birth, ID band Patient awake    Reviewed: Allergy & Precautions, H&P , NPO status , Patient's Chart, lab work & pertinent test results  Airway Mallampati: II  TM Distance: >3 FB Neck ROM: Full    Dental no notable dental hx. (+) Chipped, Dental Advisory Given   Pulmonary Current Smoker,    Pulmonary exam normal breath sounds clear to auscultation       Cardiovascular hypertension,  Rhythm:Regular Rate:Normal     Neuro/Psych negative neurological ROS  negative psych ROS   GI/Hepatic negative GI ROS, (+)     substance abuse  cocaine use,   Endo/Other  negative endocrine ROS  Renal/GU negative Renal ROS  negative genitourinary   Musculoskeletal   Abdominal   Peds  Hematology negative hematology ROS (+)   Anesthesia Other Findings   Reproductive/Obstetrics negative OB ROS                            Anesthesia Physical Anesthesia Plan  ASA: II  Anesthesia Plan: General   Post-op Pain Management:    Induction: Intravenous  PONV Risk Score and Plan: 2 and Ondansetron, Dexamethasone and Midazolam  Airway Management Planned: Oral ETT  Additional Equipment:   Intra-op Plan:   Post-operative Plan: Extubation in OR  Informed Consent: I have reviewed the patients History and Physical, chart, labs and discussed the procedure including the risks, benefits and alternatives for the proposed anesthesia with the patient or authorized representative who has indicated his/her understanding and acceptance.   Dental advisory given  Plan Discussed with: CRNA  Anesthesia Plan Comments:         Anesthesia Quick Evaluation

## 2018-02-02 NOTE — Anesthesia Procedure Notes (Signed)
Procedure Name: Intubation Date/Time: 02/02/2018 12:54 PM Performed by: Anne Fu, CRNA Pre-anesthesia Checklist: Patient identified, Emergency Drugs available, Suction available, Patient being monitored and Timeout performed Patient Re-evaluated:Patient Re-evaluated prior to induction Oxygen Delivery Method: Circle system utilized Preoxygenation: Pre-oxygenation with 100% oxygen Induction Type: IV induction Ventilation: Mask ventilation without difficulty Laryngoscope Size: Mac and 4 Grade View: Grade I Tube type: Oral Tube size: 7.5 mm Number of attempts: 1 Airway Equipment and Method: Stylet Placement Confirmation: ETT inserted through vocal cords under direct vision,  positive ETCO2 and breath sounds checked- equal and bilateral Secured at: 21 cm Tube secured with: Tape Dental Injury: Teeth and Oropharynx as per pre-operative assessment

## 2018-02-02 NOTE — Progress Notes (Signed)
PROGRESS NOTE [facility]   Leroy Harrell   URK:[id number] DOB: June 28, 1961  DOA: 01/31/2018 PCP: System, Pcp Not In   Brief Narrative:  Leroy Harrell is a 57 year old male with significant history of hypertension, alcohol and tobacco abuse who presented to the emergency department complaining of abdominal pain and fatigue, associated with jaundice and dark urine.  Upon initial evaluation patient did not appear to be in acute distress but laboratory finding shows that TBD of 25.5, AST up to 139, ALT 144, ALP 525 with normal INR and PTT.  Alcohol levels were negative.  Right upper quadrant ultrasound showed dilated common bile duct with intrahepatic biliary ductal dilation of unclear etiology.  Wall thickening severe gallbladder measuring up to 10 mm without cholelithiasis.  Patient was admitted with working diagnosis of acute alcoholic hepatitis versus acute cholecystitis.  GI was consulted recommended CT of the abdomen which showed intrahepatic biliary dilation with cord of the common bile duct there is irregular gallbladder wall thickening with calcification finding concern of malignancy ?  Cholangiocarcinoma.  Subjective: Patient seen and examined, feeling much better today.  Pain has resolved.  Status post ERCP with stent.  Assessment & Plan: Obstructive jaundice Concern for cholangiocarcinoma versus gallbladder carcinoma Patient report weight loss 30 pounds in about 2-3 months. T bili and LFTs continues to be elevated Status post ERCP with stent placement on CBD.  Biopsy were performed and sent for sample Currently on Zosyn due to concern of possible early cholecystitis will continue for now. Monitor CMP in the morning Patient is from [facility], he does not see a physician regularly.  Will need to control symptoms and establish a diagnosis in order to get him to [facility] for further workup and outpatient treatment  History of hypertension BP stable  during hospital stay Patient has  not been compliant with medication for years Will continue to monitor of antihypertensive  Hypokalemia - resolved Continue to monitor  Alcohol abuse Alcohol cessation discussed, can discontinue CIWA protocol, no signs of alcohol withdrawals.    DVT prophylaxis: Subcutaneous heparin Code Status: Full code Family Communication: None at bedside Disposition Plan: Home when established diagnosis and symptom control  Consultants:   GI -Dr. Loletha Carrow  Procedures:   None  Antimicrobials:  Zosyn   Objective: Vitals:   02/02/18 1415 02/02/18 1430 02/02/18 1445 02/02/18 1502  BP: 125/76 117/79 129/77 (!) 136/92  Pulse: 65 (!) 51 (!) 54 62  Resp: 16 14 16 16   Temp:   98.5 F (36.9 C) 99.2 F (37.3 C)  TempSrc:      SpO2: 100% 99% 99% 98%  Weight:      Height:        Intake/Output Summary (Last 24 hours) at 02/02/2018 1605 Last data filed at 02/02/2018 1533 Gross per 24 hour  Intake 550 ml  Output 1205 ml  Net -655 ml   Filed Weights   01/31/18 1253 02/02/18 1140  Weight: 65.7 kg (144 lb 13.5 oz) 65.3 kg (144 lb)    Examination:  General: Pt is alert, awake, not in acute distress HEENT: Scleral icterus Cardiovascular: RRR, S1/S2 +, no rubs, no gallops Respiratory: CTA bilaterally, no wheezing, no rhonchi Abdominal: Soft, NT, ND, bowel sounds + Extremities: no edema Skin: Jaundice  Data Reviewed: I have personally reviewed following labs and imaging studies  CBC: Recent Labs  Lab 01/31/18 0544 01/31/18 0956 02/01/18 0453  WBC 10.4 10.5 9.4  NEUTROABS 7.7  --   --   HGB  10.9* 10.2* 9.6*  HCT 31.7* 29.6* 28.5*  MCV 89.8 89.2 88.8  PLT 405* 387 237*   Basic Metabolic Panel: Recent Labs  Lab 01/31/18 0544 01/31/18 0956 02/01/18 0453 02/02/18 0413  NA 134*  --  135 136  K 3.2*  --  3.4* 3.6  CL 101  --  102 102  CO2 23  --  24 25  GLUCOSE 93  --  99 95  BUN 6  --  10 7  CREATININE 0.40* 0.37* 0.62 0.53*  CALCIUM  9.3  --  8.5* 8.9  MG  --   --   --  1.9   GFR: Estimated Creatinine Clearance: 95.2 mL/min (A) (by C-G formula based on SCr of 0.53 mg/dL (L)). Liver Function Tests: Recent Labs  Lab 01/31/18 0544 02/01/18 0453 02/02/18 0413  AST 139* 136* 141*  ALT 144* 118* 131*  ALKPHOS 525* 438* 445*  BILITOT 25.5* 19.8* 19.6*  PROT 7.0 5.8* 6.0*  ALBUMIN 2.5* 1.9* 2.0*   Recent Labs  Lab 01/31/18 0544  LIPASE 41   No results for input(s): AMMONIA in the last 168 hours. Coagulation Profile: Recent Labs  Lab 01/31/18 0544 02/01/18 0453  INR 0.97 1.16   Cardiac Enzymes: No results for input(s): CKTOTAL, CKMB, CKMBINDEX, TROPONINI in the last 168 hours. BNP (last 3 results) No results for input(s): PROBNP in the last 8760 hours. HbA1C: No results for input(s): HGBA1C in the last 72 hours. CBG: Recent Labs  Lab 02/01/18 1129 02/01/18 1758 02/01/18 2304 02/02/18 1128 02/02/18 1413  GLUCAP 106* 91 129* 81 108*   Lipid Profile: No results for input(s): CHOL, HDL, LDLCALC, TRIG, CHOLHDL, LDLDIRECT in the last 72 hours. Thyroid Function Tests: No results for input(s): TSH, T4TOTAL, FREET4, T3FREE, THYROIDAB in the last 72 hours. Anemia Panel: No results for input(s): VITAMINB12, FOLATE, FERRITIN, TIBC, IRON, RETICCTPCT in the last 72 hours. Sepsis Labs: Recent Labs  Lab 01/31/18 0555  LATICACIDVEN 0.65    Recent Results (from the past 240 hour(s))  Culture, blood (routine x 2)     Status: None (Preliminary result)   Collection Time: 01/31/18  9:57 AM  Result Value Ref Range Status   Specimen Description   Final    BLOOD LEFT FOREARM Performed at [facility] [address], [place], [place] [address]    Special Requests   Final    BOTTLES DRAWN AEROBIC AND ANAEROBIC Blood Culture adequate volume Performed at [facility] [address], [place], [place] [address]    Culture   Final    NO GROWTH 2 DAYS Performed at [facility] [address] [place], [place] [address]    Report Status PENDING  Incomplete  Culture, blood (routine x 2)     Status: None (Preliminary result)   Collection Time: 01/31/18 10:06 AM  Result Value Ref Range Status   Specimen Description   Final    BLOOD LEFT HAND Performed at [facility] [address], [place], [place] [address]    Special Requests   Final    BOTTLES DRAWN AEROBIC AND ANAEROBIC Blood Culture adequate volume Performed at [facility] [address], [place], [place] [address]    Culture   Final    NO GROWTH 2 DAYS Performed at [facility] [address], [place], [place] [address]    Report Status PENDING  Incomplete      Radiology Studies: Dg Ercp  Result Date: 02/02/2018 CLINICAL DATA:  Biliary dilatation.  Obstructive jaundice. EXAM: ERCP TECHNIQUE: Multiple spot images obtained with the fluoroscopic device and submitted for interpretation post-procedure. FLUOROSCOPY TIME:  Fluoroscopy Time:  2 minutes and 28 seconds Number of Acquired Spot Images: 5 COMPARISON:  Abdominal CT 01/31/2018 FINDINGS: Cannulation and opacification of the common bile duct. Wire was advanced into the intrahepatic bile ducts. No significant dilatation of the common bile duct. There is marked dilatation of the intrahepatic ducts. Evidence for a stenosis in the region of the biliary bifurcation and common hepatic duct. A nonmetallic biliary stent was placed. Catheter extends into the intrahepatic biliary system. Partial filling of the cystic duct. IMPRESSION: Biliary obstruction/stenosis near the common hepatic duct and biliary bifurcation. Placement of biliary stent. These images were submitted for radiologic interpretation only. Please see the procedural report for the amount of contrast and the fluoroscopy time utilized. Electronically Signed   By: Markus Daft M.D.   On: 02/02/2018 15:42      Scheduled Meds: . feeding supplement  (ENSURE ENLIVE)  237 mL Oral TID BM  . folic acid  1 mg Oral Daily  . heparin  5,000 Units Subcutaneous Q8H  . insulin aspart  0-9 Units Subcutaneous TID WC  . iopamidol  30 mL Oral Once  . multivitamin with minerals  1 tablet Oral Daily  . thiamine  100 mg Oral Daily   Or  . thiamine  100 mg Intravenous Daily   Continuous Infusions: . piperacillin-tazobactam (ZOSYN)  IV 0 g (02/02/18 0603)     LOS: 2 days    Time spent: Total of 25 minutes spent with pt, greater than 50% of which was spent in discussion of  treatment, counseling and coordination of care  Chipper Oman, MD Pager: Text Page via [email or web address]   If 7PM-7AM, please contact night-coverage [email or web address] 02/02/2018, 4:05 PM   Note - This record has been created using [employer]. Chart creation errors have been sought, but may not always have been located. Such creation errors do not reflect on the standard of medical care.

## 2018-02-02 NOTE — Op Note (Signed)
[facility] Patient Name: Leroy Harrell Procedure Date: 02/02/2018 MRN: [id number] Attending MD: Estill Cotta. Loletha Carrow , MD Date of Birth: [birth year] CSN: [id number] Age: 57 Admit Type: Inpatient Procedure:                ERCP Indications:              Abdominal pain of suspected biliary origin, Biliary                            dilation on Computed Tomogram Scan (porta hepatis                            mass), Jaundice Providers:                Mallie Mussel L. Loletha Carrow, MD, Kingsley Plan, RN, Charolette Child, Technician, Anne Fu CRNA, CRNA Referring MD:              Medicines:                General Anesthesia, patient on standing dose zosyn,                            Indomethacin 330 mg PR Complications:            No immediate complications. Estimated Blood Loss:     Estimated blood loss: none. Procedure:                Pre-Anesthesia Assessment:                           - Prior to the procedure, a History and Physical                            was performed, and patient medications and                            allergies were reviewed. The patient's tolerance of                            previous anesthesia was also reviewed. The risks                            and benefits of the procedure and the sedation                            options and risks were discussed with the patient.                            All questions were answered, and informed consent                            was obtained. Prior Anticoagulants: The patient has  taken SQ heparin, last dose was day of procedure.                            ASA Grade Assessment: III - A patient with severe                            systemic disease. After reviewing the risks and                            benefits, the patient was deemed in satisfactory                            condition to undergo the procedure.                           After obtaining  informed consent, the scope was                            passed under direct vision. Throughout the                            procedure, the patient's blood pressure, pulse, and                            oxygen saturations were monitored continuously. The                            was introduced through the mouth, and used to                            inject contrast into and used to inject contrast                            into the bile duct. The ERCP was accomplished                            without difficulty. The patient tolerated the                            procedure well. Scope In: Scope Out: Findings:      The scout film was normal. The esophagus was successfully intubated       under direct vision. The scope was advanced to a normal major papilla in       the descending duodenum without detailed examination of the pharynx,       larynx and associated structures, and upper GI tract.The major papilla       appeared to have sepaate biliary and pancreatic duct openings. The upper       GI tract was grossly normal. 0.035 inch x 260 cm straight Hydra Jagwire       was passed into the biliary tree. The traction (standard) sphincterotome       was passed over the guidewire and the bile duct was then deeply       cannulated. Contrast was injected. I personally interpreted the  bile       duct images. There was brisk flow of contrast through the ducts. Image       quality was excellent. Contrast extended to the hepatic ducts. The       gallbladder was not visualized. The left and right hepatic ducts and all       intrahepatic branches were diffusely dilated. The common hepatic duct       and bifurcation contained a single severe stenosis about 20 mm in       length. Cells for cytology were obtained by brushing. One 7 Fr by 9 cm       plastic stent was placed 8 cm into the bile duct, with the proximal end       in the left intrahepatic duct. The stent was in good position. The  total       fluoroscopy exposure time was 2 minutes and 33 seconds. 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                           - The left and right hepatic ducts and all                            intrahepatic branches were dilated.                           - One plastic stent was placed into the common bile                            duct. Moderate Sedation:      GETA Recommendation:           - Return patient to hospital ward for ongoing care.                           Regular diet                           follow LFTs, CA 19-9 result and biliary brushings Procedure Code(s):        --- Professional ---                           [phone], Endoscopic retrograde                            cholangiopancreatography (ERCP); with placement of                            endoscopic stent into biliary or pancreatic duct,                            including pre- and post-dilation and guide wire  passage, when performed, including sphincterotomy,                            when performed, each stent Diagnosis Code(s):        --- Professional ---                           K83.1, Obstruction of bile duct                           K83.8, Other specified diseases of biliary tract                           R10.9, Unspecified abdominal pain                           R17, Unspecified jaundice CPT copyright 2016 American [employer]. All rights reserved. The codes documented in this report are preliminary and upon coder review may  be revised to meet current compliance requirements. Twan Harkin L. Loletha Carrow, MD 02/02/2018 1:59:37 PM This report has been signed electronically. Number of Addenda: 0

## 2018-02-03 ENCOUNTER — Encounter (HOSPITAL_COMMUNITY): Payer: Self-pay | Admitting: Gastroenterology

## 2018-02-03 DIAGNOSIS — K831 Obstruction of bile duct: Secondary | ICD-10-CM

## 2018-02-03 DIAGNOSIS — R188 Other ascites: Secondary | ICD-10-CM

## 2018-02-03 LAB — COMPREHENSIVE METABOLIC PANEL
ALK PHOS: 441 U/L — AB (ref 38–126)
ALT: 123 U/L — AB (ref 17–63)
AST: 122 U/L — ABNORMAL HIGH (ref 15–41)
Albumin: 1.9 g/dL — ABNORMAL LOW (ref 3.5–5.0)
Anion gap: 8 (ref 5–15)
BUN: 13 mg/dL (ref 6–20)
CALCIUM: 8.7 mg/dL — AB (ref 8.9–10.3)
CO2: 26 mmol/L (ref 22–32)
Chloride: 101 mmol/L (ref 101–111)
Creatinine, Ser: 0.75 mg/dL (ref 0.61–1.24)
Glucose, Bld: 137 mg/dL — ABNORMAL HIGH (ref 65–99)
Potassium: 3.8 mmol/L (ref 3.5–5.1)
Sodium: 135 mmol/L (ref 135–145)
Total Bilirubin: 15.8 mg/dL — ABNORMAL HIGH (ref 0.3–1.2)
Total Protein: 5.9 g/dL — ABNORMAL LOW (ref 6.5–8.1)

## 2018-02-03 LAB — CBC WITH DIFFERENTIAL/PLATELET
BASOS PCT: 0 %
Basophils Absolute: 0 10*3/uL (ref 0.0–0.1)
EOS ABS: 0 10*3/uL (ref 0.0–0.7)
Eosinophils Relative: 0 %
HCT: 28.5 % — ABNORMAL LOW (ref 39.0–52.0)
Hemoglobin: 9.6 g/dL — ABNORMAL LOW (ref 13.0–17.0)
LYMPHS ABS: 2.1 10*3/uL (ref 0.7–4.0)
Lymphocytes Relative: 13 %
MCH: 30.4 pg (ref 26.0–34.0)
MCHC: 33.7 g/dL (ref 30.0–36.0)
MCV: 90.2 fL (ref 78.0–100.0)
Monocytes Absolute: 1.3 10*3/uL — ABNORMAL HIGH (ref 0.1–1.0)
Monocytes Relative: 8 %
NEUTROS PCT: 79 %
Neutro Abs: 12.4 10*3/uL — ABNORMAL HIGH (ref 1.7–7.7)
PLATELETS: 402 10*3/uL — AB (ref 150–400)
RBC: 3.16 MIL/uL — AB (ref 4.22–5.81)
RDW: 20.8 % — AB (ref 11.5–15.5)
WBC: 15.8 10*3/uL — AB (ref 4.0–10.5)

## 2018-02-03 LAB — GLUCOSE, CAPILLARY
GLUCOSE-CAPILLARY: 115 mg/dL — AB (ref 65–99)
Glucose-Capillary: 107 mg/dL — ABNORMAL HIGH (ref 65–99)
Glucose-Capillary: 109 mg/dL — ABNORMAL HIGH (ref 65–99)
Glucose-Capillary: 136 mg/dL — ABNORMAL HIGH (ref 65–99)

## 2018-02-03 LAB — MAGNESIUM: MAGNESIUM: 2 mg/dL (ref 1.7–2.4)

## 2018-02-03 NOTE — Progress Notes (Signed)
Progress Note   Subjective  Chief Complaint: Obstructive jaundice, s/p ERCP 02/02/18  Patient reports doing well this morning, mild epigastric/right upper quadrant discomfort, no change from yesterday.  Did eat a regular diet this morning with no nausea or increased abdominal pain.  Last bowel movement was last night.  No new complaints this morning.   Objective   Vital signs in last 24 hours: Temp:  [97.5 F (36.4 C)-99.4 F (37.4 C)] 98.4 F (36.9 C) (03/25 0600) Pulse Rate:  [51-86] 63 (03/25 0600) Resp:  [14-18] 16 (03/25 0600) BP: (117-153)/(75-92) 141/90 (03/25 0600) SpO2:  [94 %-100 %] 98 % (03/25 0600) Weight:  [144 lb (65.3 kg)] 144 lb (65.3 kg) (03/24 1140) Last BM Date: 02/02/18 General:    AA male in NAD Heart:  Regular rate and rhythm; no murmurs Lungs: Respirations even and unlabored, lungs CTA bilaterally Abdomen:  Soft, mild epigastric ttp and nondistended. Normal bowel sounds. Extremities:  Without edema. Neurologic:  Alert and oriented,  grossly normal neurologically. Psych:  Cooperative. Normal mood and affect.  Intake/Output from previous day: 03/24 0701 - 03/25 0700 In: 870 [P.O.:320; I.V.:400; IV Piggyback:150] Out: 805 [Urine:800; Blood:5]  Lab Results: Recent Labs    02/01/18 0453 02/03/18 0429  WBC 9.4 15.8*  HGB 9.6* 9.6*  HCT 28.5* 28.5*  PLT 401* 402*   BMET Recent Labs    02/01/18 0453 02/02/18 0413 02/03/18 0429  NA 135 136 135  K 3.4* 3.6 3.8  CL 102 102 101  CO2 24 25 26   GLUCOSE 99 95 137*  BUN 10 7 13   CREATININE 0.62 0.53* 0.75  CALCIUM 8.5* 8.9 8.7*   LFT Recent Labs    02/03/18 0429  PROT 5.9*  ALBUMIN 1.9*  AST 122*  ALT 123*  ALKPHOS 441*  BILITOT 15.8*   PT/INR Recent Labs    02/01/18 0453  LABPROT 14.7  INR 1.16    Studies/Results: Dg Ercp  Result Date: 02/02/2018 CLINICAL DATA:  Biliary dilatation.  Obstructive jaundice. EXAM: ERCP TECHNIQUE: Multiple spot images obtained with the  fluoroscopic device and submitted for interpretation post-procedure. FLUOROSCOPY TIME:  Fluoroscopy Time:  2 minutes and 28 seconds Number of Acquired Spot Images: 5 COMPARISON:  Abdominal CT 01/31/2018 FINDINGS: Cannulation and opacification of the common bile duct. Wire was advanced into the intrahepatic bile ducts. No significant dilatation of the common bile duct. There is marked dilatation of the intrahepatic ducts. Evidence for a stenosis in the region of the biliary bifurcation and common hepatic duct. A nonmetallic biliary stent was placed. Catheter extends into the intrahepatic biliary system. Partial filling of the cystic duct. IMPRESSION: Biliary obstruction/stenosis near the common hepatic duct and biliary bifurcation. Placement of biliary stent. These images were submitted for radiologic interpretation only. Please see the procedural report for the amount of contrast and the fluoroscopy time utilized. Electronically Signed   By: Markus Daft M.D.   On: 02/02/2018 15:42    Assessment / Plan:   Assessment: 1.  Obstructive jaundice: Concern for cholangiocarcinoma, reported weight loss 30 pounds in the past 2-3 months, T bili and LFTs are now decreasing since stent placement on 02/02/18, Zosyn continues 2.  Alcohol abuse  Plan: 1.  Cytology pending from ERCP yesterday 2.  CA-19-9 pending 3.  Continue supportive measures and current diet 4.  Please await any further recommendations from Dr. Ardis Hughs later today  Thank you for your kind consultation, we will continue to follow along.   LOS: 3 days  Lavone Nian [facility]  02/03/2018, 10:19 AM  Pager # [phone]

## 2018-02-03 NOTE — Progress Notes (Signed)
PROGRESS NOTE [facility]   Leroy Harrell   YFV:[id number] DOB: [birth year]  DOA: 01/31/2018 PCP: System, Pcp Not In   Brief Narrative:  Leroy Harrell is a 57 year old male with significant history of hypertension, alcohol and tobacco abuse who presented to the emergency department complaining of abdominal pain and fatigue, associated with jaundice and dark urine.  Upon initial evaluation patient did not appear to be in acute distress but laboratory finding shows that TBD of 25.5, AST up to 139, ALT 144, ALP 525 with normal INR and PTT.  Alcohol levels were negative.  Right upper quadrant ultrasound showed dilated common bile duct with intrahepatic biliary ductal dilation of unclear etiology.  Wall thickening severe gallbladder measuring up to 10 mm without cholelithiasis.  Patient was admitted with working diagnosis of acute alcoholic hepatitis versus acute cholecystitis.  GI was consulted recommended CT of the abdomen which showed intrahepatic biliary dilation with cord of the common bile duct there is irregular gallbladder wall thickening with calcification finding concern of malignancy ?  Cholangiocarcinoma.  Subjective: Patient seen and examined, feeling okay.  Abdominal pain on and off.  LFTs somewhat improved compared to yesterday.  Assessment & Plan: Obstructive jaundice Concern for cholangiocarcinoma versus gallbladder carcinoma Patient report weight loss 30 pounds in about 2-3 months. Status post ERCP with stent placement on CBD 3/24.  Biliary stricture brushing cytology pending I have discussed possibility of malignancy and further treatment which could include chemotherapy, radiation and surgeries.  Patient would like to wait until biopsy results to make a decision of where he is going to take care of this matter.  Patient is homeless Patient currently on Zosyn for possible early cholecystitis. Can de-escalate to Augmentin in AM if remains afebrile and clinically  improving. Monitor liver function.  History of hypertension BP remains stable Patient has not been compliant with medication for years Will continue to monitor off antihypertensive  Hypokalemia - resolved  Alcohol abuse No signs of alcohol withdrawal, alcohol cessation discussed  DVT prophylaxis: Subcutaneous heparin Code Status: Full code Family Communication: None at bedside Disposition Plan: Home when established diagnosis and symptom control  Consultants:   GI -Dr. Loletha Carrow  Procedures:   None  Antimicrobials:  Zosyn   Objective: Vitals:   02/02/18 2121 02/03/18 0600 02/03/18 1357 02/03/18 1606  BP: 132/76 (!) 141/90 134/88   Pulse: 81 63 76   Resp: 16 16 18    Temp: 99.3 F (37.4 C) 98.4 F (36.9 C) (!) 100.6 F (38.1 C) 99 F (37.2 C)  TempSrc: Oral Oral Oral Oral  SpO2: 95% 98% 99%   Weight:      Height:        Intake/Output Summary (Last 24 hours) at 02/03/2018 1855 Last data filed at 02/03/2018 1806 Gross per 24 hour  Intake 540 ml  Output 1450 ml  Net -910 ml   Filed Weights   01/31/18 1253 02/02/18 1140  Weight: 65.7 kg (144 lb 13.5 oz) 65.3 kg (144 lb)    Examination:  General: NAD Eyes: Scleral icterus Cardiovascular: RRR, S1/S2 +, no rubs, no gallops Respiratory: CTA bilaterally, no wheezing, no rhonchi Abdominal: Soft, NT, ND, bowel sounds + Skin: Jaundice   Data Reviewed: I have personally reviewed following labs and imaging studies  CBC: Recent Labs  Lab 01/31/18 0544 01/31/18 0956 02/01/18 0453 02/03/18 0429  WBC 10.4 10.5 9.4 15.8*  NEUTROABS 7.7  --   --  12.4*  HGB 10.9* 10.2* 9.6* 9.6*  HCT 31.7* 29.6*  28.5* 28.5*  MCV 89.8 89.2 88.8 90.2  PLT 405* 387 401* 272*   Basic Metabolic Panel: Recent Labs  Lab 01/31/18 0544 01/31/18 0956 02/01/18 0453 02/02/18 0413 02/03/18 0429  NA 134*  --  135 136 135  K 3.2*  --  3.4* 3.6 3.8  CL 101  --  102 102 101  CO2 23  --  24 25 26   GLUCOSE 93  --  99 95 137*  BUN 6   --  10 7 13   CREATININE 0.40* 0.37* 0.62 0.53* 0.75  CALCIUM 9.3  --  8.5* 8.9 8.7*  MG  --   --   --  1.9 2.0   GFR: Estimated Creatinine Clearance: 95.2 mL/min (by C-G formula based on SCr of 0.75 mg/dL). Liver Function Tests: Recent Labs  Lab 01/31/18 0544 02/01/18 0453 02/02/18 0413 02/03/18 0429  AST 139* 136* 141* 122*  ALT 144* 118* 131* 123*  ALKPHOS 525* 438* 445* 441*  BILITOT 25.5* 19.8* 19.6* 15.8*  PROT 7.0 5.8* 6.0* 5.9*  ALBUMIN 2.5* 1.9* 2.0* 1.9*   Recent Labs  Lab 01/31/18 0544  LIPASE 41   No results for input(s): AMMONIA in the last 168 hours. Coagulation Profile: Recent Labs  Lab 01/31/18 0544 02/01/18 0453  INR 0.97 1.16   Cardiac Enzymes: No results for input(s): CKTOTAL, CKMB, CKMBINDEX, TROPONINI in the last 168 hours. BNP (last 3 results) No results for input(s): PROBNP in the last 8760 hours. HbA1C: No results for input(s): HGBA1C in the last 72 hours. CBG: Recent Labs  Lab 02/02/18 1812 02/03/18 0041 02/03/18 0602 02/03/18 1115 02/03/18 1804  GLUCAP 270* 136* 107* 115* 109*   Lipid Profile: No results for input(s): CHOL, HDL, LDLCALC, TRIG, CHOLHDL, LDLDIRECT in the last 72 hours. Thyroid Function Tests: No results for input(s): TSH, T4TOTAL, FREET4, T3FREE, THYROIDAB in the last 72 hours. Anemia Panel: No results for input(s): VITAMINB12, FOLATE, FERRITIN, TIBC, IRON, RETICCTPCT in the last 72 hours. Sepsis Labs: Recent Labs  Lab 01/31/18 0555  LATICACIDVEN 0.65    Recent Results (from the past 240 hour(s))  Culture, blood (routine x 2)     Status: None (Preliminary result)   Collection Time: 01/31/18  9:57 AM  Result Value Ref Range Status   Specimen Description   Final    BLOOD LEFT FOREARM Performed at [facility] [address] [place], [place] [address]    Special Requests   Final    BOTTLES DRAWN AEROBIC AND ANAEROBIC Blood Culture adequate volume Performed at [facility] [address], [place], [place] [address]    Culture   Final    NO GROWTH 3 DAYS Performed at [facility], [place] [address] [place], [place] [address]    Report Status PENDING  Incomplete  Culture, blood (routine x 2)     Status: None (Preliminary result)   Collection Time: 01/31/18 10:06 AM  Result Value Ref Range Status   Specimen Description   Final    BLOOD LEFT HAND Performed at [facility] [address] [place], [place] [address]    Special Requests   Final    BOTTLES DRAWN AEROBIC AND ANAEROBIC Blood Culture adequate volume Performed at [facility] [address], [place], [place] [address]    Culture   Final    NO GROWTH 3 DAYS Performed at [facility], [place] [address] [place],  [address]    Report  Status PENDING  Incomplete      Radiology Studies: Dg Ercp  Result Date: 02/02/2018 CLINICAL DATA:  Biliary dilatation.  Obstructive jaundice. EXAM: ERCP TECHNIQUE: Multiple spot images obtained with the fluoroscopic device and submitted for interpretation post-procedure. FLUOROSCOPY TIME:  Fluoroscopy Time:  2 minutes and 28 seconds Number of Acquired Spot Images: 5 COMPARISON:  Abdominal CT 01/31/2018 FINDINGS: Cannulation and opacification of the common bile duct. Wire was advanced into the intrahepatic bile ducts. No significant dilatation of the common bile duct. There is marked dilatation of the intrahepatic ducts. Evidence for a stenosis in the region of the biliary bifurcation and common hepatic duct. A nonmetallic biliary stent was placed. Catheter extends into the intrahepatic biliary system. Partial filling of the cystic duct. IMPRESSION: Biliary obstruction/stenosis near the common hepatic duct and biliary bifurcation. Placement of biliary stent. These images were submitted for radiologic interpretation only. Please see the procedural report for the amount of contrast and the fluoroscopy time  utilized. Electronically Signed   By: Markus Daft M.D.   On: 02/02/2018 15:42      Scheduled Meds: . feeding supplement (ENSURE ENLIVE)  237 mL Oral TID BM  . folic acid  1 mg Oral Daily  . heparin  5,000 Units Subcutaneous Q8H  . insulin aspart  0-9 Units Subcutaneous TID WC  . iopamidol  30 mL Oral Once  . multivitamin with minerals  1 tablet Oral Daily  . thiamine  100 mg Oral Daily   Or  . thiamine  100 mg Intravenous Daily   Continuous Infusions: . piperacillin-tazobactam (ZOSYN)  IV 3.375 g (02/03/18 1707)     LOS: 3 days    Time spent: Total of 15 minutes spent with pt, greater than 50% of which was spent in discussion of  treatment, counseling and coordination of care  Chipper Oman, MD Pager: Text Page via [email or web address]   If 7PM-7AM, please contact night-coverage [email or web address] 02/03/2018, 6:55 PM   Note - This record has been created using [employer]. Chart creation errors have been sought, but may not always have been located. Such creation errors do not reflect on the standard of medical care.

## 2018-02-03 NOTE — Progress Notes (Signed)
Spoke with patient at bedside, states he was on his way from [place] after visiting with his brother there. He is from [facility], he is homeless and was planning on seeking shelter housing in [facility] when he returned. He states he has no income, no other family or friends other that his brother. He will decide where he wants f/u care once the diagnosis is determined(if cancer will stay here if not will return to [facility]). Consulted CSW if he stays for housing resources. Financial counselor has seen him and plans to assist with Medicaid application. [phone]

## 2018-02-03 NOTE — Progress Notes (Signed)
Chaplain following due to spiritual care consult regarding advance directive.    Leroy Harrell requested advance directive packet.  Is going to wait until he finds out about diagnosis and plans for staying in [name] vs moving to [name].  Will ask nursing to page chaplain when questions arise or ready for notarization.  Chaplain will follow for continued support around diagnosis, housing situation.    [facility] Chaplain Leroy Harrell, MDiv, BCC.

## 2018-02-04 DIAGNOSIS — R17 Unspecified jaundice: Secondary | ICD-10-CM

## 2018-02-04 LAB — CANCER ANTIGEN 19-9: CA 19-9: 41769 U/mL — ABNORMAL HIGH (ref 0–35)

## 2018-02-04 LAB — COMPREHENSIVE METABOLIC PANEL
ALBUMIN: 1.9 g/dL — AB (ref 3.5–5.0)
ALK PHOS: 436 U/L — AB (ref 38–126)
ALT: 128 U/L — AB (ref 17–63)
AST: 130 U/L — AB (ref 15–41)
Anion gap: 7 (ref 5–15)
BILIRUBIN TOTAL: 12.7 mg/dL — AB (ref 0.3–1.2)
BUN: 10 mg/dL (ref 6–20)
CO2: 27 mmol/L (ref 22–32)
CREATININE: 0.75 mg/dL (ref 0.61–1.24)
Calcium: 8.6 mg/dL — ABNORMAL LOW (ref 8.9–10.3)
Chloride: 104 mmol/L (ref 101–111)
GFR calc Af Amer: >60 mL/min (ref >60)
GFR calc non Af Amer: >60 mL/min (ref >60)
GLUCOSE: 89 mg/dL (ref 65–99)
POTASSIUM: 3.8 mmol/L (ref 3.5–5.1)
Sodium: 138 mmol/L (ref 135–145)
TOTAL PROTEIN: 5.7 g/dL — AB (ref 6.5–8.1)

## 2018-02-04 LAB — GLUCOSE, CAPILLARY
GLUCOSE-CAPILLARY: 121 mg/dL — AB (ref 65–99)
GLUCOSE-CAPILLARY: 101 mg/dL — AB (ref 65–99)
Glucose-Capillary: 101 mg/dL — ABNORMAL HIGH (ref 65–99)
Glucose-Capillary: 105 mg/dL — ABNORMAL HIGH (ref 65–99)
Glucose-Capillary: 82 mg/dL (ref 65–99)
Glucose-Capillary: 86 mg/dL (ref 65–99)

## 2018-02-04 MED ORDER — AMOXICILLIN-POT CLAVULANATE 875-125 MG PO TABS
1.0000 | ORAL_TABLET | Freq: Two times a day (BID) | ORAL | Status: AC
  Administered 2018-02-04 – 2018-02-06 (×5): 1 via ORAL
  Filled 2018-02-04 (×5): qty 1

## 2018-02-04 NOTE — Progress Notes (Addendum)
PROGRESS NOTE [facility]   Nickson Middlesworth   YTK:[id number] DOB: [birth year]  DOA: 01/31/2018 PCP: System, Pcp Not In   Brief Narrative:  Leroy Harrell is a 57 year old male with significant history of hypertension, alcohol and tobacco abuse who presented to the emergency department complaining of abdominal pain and fatigue, associated with jaundice and dark urine.  Upon initial evaluation patient did not appear to be in acute distress but laboratory finding shows that TBD of 25.5, AST up to 139, ALT 144, ALP 525 with normal INR and PTT.  Alcohol levels were negative.  Right upper quadrant ultrasound showed dilated common bile duct with intrahepatic biliary ductal dilation of unclear etiology.  Wall thickening severe gallbladder measuring up to 10 mm without cholelithiasis.  Patient was admitted with working diagnosis of acute alcoholic hepatitis versus acute cholecystitis.  GI was consulted recommended CT of the abdomen which showed intrahepatic biliary dilation with cord of the common bile duct there is irregular gallbladder wall thickening with calcification finding concern of malignancy ?  Cholangiocarcinoma.  Subjective: Patient seen and examined, mild right upper quadrant pain.  Tolerating diet well.  No other concerns.  Assessment & Plan: Obstructive jaundice Concern for cholangiocarcinoma versus gallbladder carcinoma Patient report weight loss 30 pounds in about 2-3 months. Status post ERCP with stent placement on CBD 3/24.  Biliary stricture brushing cytology still pending at 5:13 PM Awaiting results if cytology is not diagnostic he will need another biopsy likely EUS Patient initially treated with Zosyn given clinical improvement and afebrile  will de-escalate to Augmentin to complete 10 days of total of therapy will need 5 more days of p.o. antibiotics Liver enzymes trending down, GI following  History of hypertension Blood pressure remains stable Patient has not  been compliant with medication for years Will continue to monitor off antihypertensive  Hypokalemia - resolved  Alcohol abuse No signs of alcohol withdrawal, alcohol cessation discussed  Moderate malnutrition - multifactorial (social - EtOH, possible malignancy)  Supplements per dietitian   DVT prophylaxis: Subcutaneous heparin Code Status: Full code Family Communication: None at bedside Disposition Plan: Pending pathology, may need further studies  Consultants:   GI -Dr. Loletha Carrow  Procedures:   None  Antimicrobials:  Zosyn   Objective: Vitals:   02/04/18 0013 02/04/18 0532 02/04/18 0858 02/04/18 1400  BP: 120/74 (!) 141/79 133/61 132/79  Pulse: 60 (!) 57 (!) 51 (!) 53  Resp: 18 18 14 18   Temp: 98.7 F (37.1 C) 98.8 F (37.1 C) 98.8 F (37.1 C) 99.1 F (37.3 C)  TempSrc: Oral Oral Oral Oral  SpO2: 100% 100% 100% 100%  Weight:  67 kg (147 lb 11.3 oz)    Height:        Intake/Output Summary (Last 24 hours) at 02/04/2018 1712 Last data filed at 02/04/2018 1400 Gross per 24 hour  Intake 580 ml  Output 1600 ml  Net -1020 ml   Filed Weights   01/31/18 1253 02/02/18 1140 02/04/18 0532  Weight: 65.7 kg (144 lb 13.5 oz) 65.3 kg (144 lb) 67 kg (147 lb 11.3 oz)    Examination:  General: NAD Eyes: Scleral icterus improving Cardiovascular: RRR, S1/S2 +, no rubs, no gallops Respiratory: CTA bilaterally, no wheezing, no rhonchi Abdominal: Soft, mild tenderness right upper quadrant, nondistended Extremities: no edema Skin: Jaundice, improving   Data Reviewed: I have personally reviewed following labs and imaging studies  CBC: Recent Labs  Lab 01/31/18 0544 01/31/18 0956 02/01/18 0453 02/03/18 0429  WBC 10.4 10.5  9.4 15.8*  NEUTROABS 7.7  --   --  12.4*  HGB 10.9* 10.2* 9.6* 9.6*  HCT 31.7* 29.6* 28.5* 28.5*  MCV 89.8 89.2 88.8 90.2  PLT 405* 387 401* 062*   Basic Metabolic Panel: Recent Labs  Lab 01/31/18 0544 01/31/18 0956 02/01/18 0453  02/02/18 0413 02/03/18 0429 02/04/18 0534  NA 134*  --  135 136 135 138  K 3.2*  --  3.4* 3.6 3.8 3.8  CL 101  --  102 102 101 104  CO2 23  --  24 25 26 27   GLUCOSE 93  --  99 95 137* 89  BUN 6  --  10 7 13 10   CREATININE 0.40* 0.37* 0.62 0.53* 0.75 0.75  CALCIUM 9.3  --  8.5* 8.9 8.7* 8.6*  MG  --   --   --  1.9 2.0  --    GFR: Estimated Creatinine Clearance: 96.4 mL/min (by C-G formula based on SCr of 0.75 mg/dL). Liver Function Tests: Recent Labs  Lab 01/31/18 0544 02/01/18 0453 02/02/18 0413 02/03/18 0429 02/04/18 0534  AST 139* 136* 141* 122* 130*  ALT 144* 118* 131* 123* 128*  ALKPHOS 525* 438* 445* 441* 436*  BILITOT 25.5* 19.8* 19.6* 15.8* 12.7*  PROT 7.0 5.8* 6.0* 5.9* 5.7*  ALBUMIN 2.5* 1.9* 2.0* 1.9* 1.9*   Recent Labs  Lab 01/31/18 0544  LIPASE 41   No results for input(s): AMMONIA in the last 168 hours. Coagulation Profile: Recent Labs  Lab 01/31/18 0544 02/01/18 0453  INR 0.97 1.16   Cardiac Enzymes: No results for input(s): CKTOTAL, CKMB, CKMBINDEX, TROPONINI in the last 168 hours. BNP (last 3 results) No results for input(s): PROBNP in the last 8760 hours. HbA1C: No results for input(s): HGBA1C in the last 72 hours. CBG: Recent Labs  Lab 02/04/18 0011 02/04/18 0529 02/04/18 0730 02/04/18 1145 02/04/18 1632  GLUCAP 105* 86 82 101* 121*   Lipid Profile: No results for input(s): CHOL, HDL, LDLCALC, TRIG, CHOLHDL, LDLDIRECT in the last 72 hours. Thyroid Function Tests: No results for input(s): TSH, T4TOTAL, FREET4, T3FREE, THYROIDAB in the last 72 hours. Anemia Panel: No results for input(s): VITAMINB12, FOLATE, FERRITIN, TIBC, IRON, RETICCTPCT in the last 72 hours. Sepsis Labs: Recent Labs  Lab 01/31/18 0555  LATICACIDVEN 0.65    Recent Results (from the past 240 hour(s))  Culture, blood (routine x 2)     Status: None (Preliminary result)   Collection Time: 01/31/18  9:57 AM  Result Value Ref Range Status   Specimen Description    Final    BLOOD LEFT FOREARM Performed at [facility] [address] [place], [place] [address]    Special Requests   Final    BOTTLES DRAWN AEROBIC AND ANAEROBIC Blood Culture adequate volume Performed at [facility] [address] [place], [place] [address]    Culture   Final    NO GROWTH 4 DAYS Performed at [facility], [place] [address], [place], [place] [address]    Report Status PENDING  Incomplete  Culture, blood (routine x 2)     Status: None (Preliminary result)   Collection Time: 01/31/18 10:06 AM  Result Value Ref Range Status   Specimen Description   Final    BLOOD LEFT HAND Performed at [facility] [address], [place], [place] [address]    Special Requests   Final    BOTTLES DRAWN AEROBIC AND ANAEROBIC Blood Culture adequate volume Performed  at [facility], [place] [address], [place], [place] [address]    Culture   Final    NO GROWTH 4 DAYS Performed at [facility], [place] [address], [place], [place] [address]    Report Status PENDING  Incomplete      Radiology Studies: No results found.    Scheduled Meds: . feeding supplement (ENSURE ENLIVE)  237 mL Oral TID BM  . folic acid  1 mg Oral Daily  . heparin  5,000 Units Subcutaneous Q8H  . insulin aspart  0-9 Units Subcutaneous TID WC  . iopamidol  30 mL Oral Once  . multivitamin with minerals  1 tablet Oral Daily  . thiamine  100 mg Oral Daily   Or  . thiamine  100 mg Intravenous Daily   Continuous Infusions: . piperacillin-tazobactam (ZOSYN)  IV Stopped (02/04/18 1451)     LOS: 4 days    Time spent: Total of 15 minutes spent with pt, greater than 50% of which was spent in discussion of  treatment, counseling and coordination of care  Chipper Oman, MD Pager: Text Page via [email or web address]   If 7PM-7AM, please contact night-coverage [email or web address] 02/04/2018, 5:12 PM   Note - This record has been created  using [employer]. Chart creation errors have been sought, but may not always have been located. Such creation errors do not reflect on the standard of medical care.

## 2018-02-04 NOTE — Progress Notes (Signed)
Progress Note   Subjective  Chief Complaint: Obstructive jaundice, status post ERCP 02/02/18  Patient found resting, continues to complain of a mild right upper quadrant pain but believes this is because "I was laying on the side".  Ate well for breakfast this morning and had a "large bowel movement" yesterday.  Denies any new complaints or concerns.   Objective   Vital signs in last 24 hours: Temp:  [98.7 F (37.1 C)-100.6 F (38.1 C)] 98.8 F (37.1 C) (03/26 0858) Pulse Rate:  [51-76] 51 (03/26 0858) Resp:  [14-18] 14 (03/26 0858) BP: (120-141)/(61-88) 133/61 (03/26 0858) SpO2:  [99 %-100 %] 100 % (03/26 0858) Weight:  [147 lb 11.3 oz (67 kg)] 147 lb 11.3 oz (67 kg) (03/26 0532) Last BM Date: 02/03/18 General:    AA male in NAD, icterus Heart:  Regular rate and rhythm; no murmurs Lungs: Respirations even and unlabored, lungs CTA bilaterally Abdomen:  Soft, mild right upper quadrant TTP, and nondistended. Normal bowel sounds. Extremities:  Without edema. Neurologic:  Alert and oriented,  grossly normal neurologically. Psych:  Cooperative. Normal mood and affect.  Intake/Output from previous day: 03/25 0701 - 03/26 0700 In: 390 [P.O.:240; IV Piggyback:150] Out: 1875 [Urine:1875]  Lab Results: Recent Labs    02/03/18 0429  WBC 15.8*  HGB 9.6*  HCT 28.5*  PLT 402*   BMET Recent Labs    02/02/18 0413 02/03/18 0429 02/04/18 0534  NA 136 135 138  K 3.6 3.8 3.8  CL 102 101 104  CO2 25 26 27   GLUCOSE 95 137* 89  BUN 7 13 10   CREATININE 0.53* 0.75 0.75  CALCIUM 8.9 8.7* 8.6*   LFT Recent Labs    02/04/18 0534  PROT 5.7*  ALBUMIN 1.9*  AST 130*  ALT 128*  ALKPHOS 436*  BILITOT 12.7*   Studies/Results: Dg Ercp  Result Date: 02/02/2018 CLINICAL DATA:  Biliary dilatation.  Obstructive jaundice. EXAM: ERCP TECHNIQUE: Multiple spot images obtained with the fluoroscopic device and submitted for interpretation post-procedure. FLUOROSCOPY TIME:  Fluoroscopy  Time:  2 minutes and 28 seconds Number of Acquired Spot Images: 5 COMPARISON:  Abdominal CT 01/31/2018 FINDINGS: Cannulation and opacification of the common bile duct. Wire was advanced into the intrahepatic bile ducts. No significant dilatation of the common bile duct. There is marked dilatation of the intrahepatic ducts. Evidence for a stenosis in the region of the biliary bifurcation and common hepatic duct. A nonmetallic biliary stent was placed. Catheter extends into the intrahepatic biliary system. Partial filling of the cystic duct. IMPRESSION: Biliary obstruction/stenosis near the common hepatic duct and biliary bifurcation. Placement of biliary stent. These images were submitted for radiologic interpretation only. Please see the procedural report for the amount of contrast and the fluoroscopy time utilized. Electronically Signed   By: Markus Daft M.D.   On: 02/02/2018 15:42       Assessment / Plan:   Assessment: 1.  Obstructive jaundice: Concern for cholangiocarcinoma, reportedly lost 30 pounds in the past 2-3 months, LFTs are very slowly trending down, hopefully a sign that the stent placed 02/02/18 is functioning well, biliary stricture brushings are pending 2.  Alcohol abuse  Plan: 1.  Cytology pending from ERCP 02/02/18, CA-19-9 pending from 02/03/18 2.  Continue supportive measures and current diet 3.  Please await any further recommendations from Dr. Ardis Hughs later today  Thank you for your kind consultation, we will continue to follow along.    LOS: 4 days   Leroy Harrell  Leroy Harrell  02/04/2018, 9:50 AM  Pager # [phone]

## 2018-02-05 ENCOUNTER — Inpatient Hospital Stay (HOSPITAL_COMMUNITY): Payer: Self-pay

## 2018-02-05 DIAGNOSIS — E440 Moderate protein-calorie malnutrition: Secondary | ICD-10-CM

## 2018-02-05 DIAGNOSIS — Z6823 Body mass index (BMI) 23.0-23.9, adult: Secondary | ICD-10-CM

## 2018-02-05 DIAGNOSIS — Z8679 Personal history of other diseases of the circulatory system: Secondary | ICD-10-CM

## 2018-02-05 DIAGNOSIS — F1210 Cannabis abuse, uncomplicated: Secondary | ICD-10-CM

## 2018-02-05 DIAGNOSIS — C240 Malignant neoplasm of extrahepatic bile duct: Secondary | ICD-10-CM

## 2018-02-05 DIAGNOSIS — F1410 Cocaine abuse, uncomplicated: Secondary | ICD-10-CM

## 2018-02-05 DIAGNOSIS — F17210 Nicotine dependence, cigarettes, uncomplicated: Secondary | ICD-10-CM

## 2018-02-05 LAB — CULTURE, BLOOD (ROUTINE X 2)
CULTURE: NO GROWTH
Culture: NO GROWTH
SPECIAL REQUESTS: ADEQUATE
SPECIAL REQUESTS: ADEQUATE

## 2018-02-05 LAB — GLUCOSE, CAPILLARY
GLUCOSE-CAPILLARY: 100 mg/dL — AB (ref 65–99)
Glucose-Capillary: 88 mg/dL (ref 65–99)
Glucose-Capillary: 83 mg/dL (ref 65–99)
Glucose-Capillary: 100 mg/dL — ABNORMAL HIGH (ref 65–99)

## 2018-02-05 LAB — CBC WITH DIFFERENTIAL/PLATELET
BASOS PCT: 0 %
Basophils Absolute: 0 10*3/uL (ref 0.0–0.1)
EOS PCT: 2 %
Eosinophils Absolute: 0.2 10*3/uL (ref 0.0–0.7)
HCT: 29.1 % — ABNORMAL LOW (ref 39.0–52.0)
Hemoglobin: 9.6 g/dL — ABNORMAL LOW (ref 13.0–17.0)
Lymphocytes Relative: 23 %
Lymphs Abs: 2.6 10*3/uL (ref 0.7–4.0)
MCH: 30.5 pg (ref 26.0–34.0)
MCHC: 33 g/dL (ref 30.0–36.0)
MCV: 92.4 fL (ref 78.0–100.0)
MONO ABS: 1.3 10*3/uL — AB (ref 0.1–1.0)
Monocytes Relative: 11 %
NEUTROS ABS: 7.4 10*3/uL (ref 1.7–7.7)
NEUTROS PCT: 64 %
PLATELETS: 395 10*3/uL (ref 150–400)
RBC: 3.15 MIL/uL — ABNORMAL LOW (ref 4.22–5.81)
RDW: 22 % — ABNORMAL HIGH (ref 11.5–15.5)
WBC: 11.5 10*3/uL — ABNORMAL HIGH (ref 4.0–10.5)

## 2018-02-05 LAB — COMPREHENSIVE METABOLIC PANEL
ALBUMIN: 1.9 g/dL — AB (ref 3.5–5.0)
ALT: 129 U/L — AB (ref 17–63)
AST: 133 U/L — AB (ref 15–41)
Alkaline Phosphatase: 468 U/L — ABNORMAL HIGH (ref 38–126)
Anion gap: 8 (ref 5–15)
BUN: 11 mg/dL (ref 6–20)
CO2: 26 mmol/L (ref 22–32)
CREATININE: 0.73 mg/dL (ref 0.61–1.24)
Calcium: 8.8 mg/dL — ABNORMAL LOW (ref 8.9–10.3)
Chloride: 102 mmol/L (ref 101–111)
GFR calc non Af Amer: >60 mL/min (ref >60)
GLUCOSE: 94 mg/dL (ref 65–99)
Potassium: 3.6 mmol/L (ref 3.5–5.1)
SODIUM: 136 mmol/L (ref 135–145)
Total Bilirubin: 12.2 mg/dL — ABNORMAL HIGH (ref 0.3–1.2)
Total Protein: 5.9 g/dL — ABNORMAL LOW (ref 6.5–8.1)

## 2018-02-05 NOTE — Progress Notes (Addendum)
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  We will alert oncology.  Please call, page if any further questions or concerns.   Owens Loffler, MD [facility] Pager [phone]

## 2018-02-05 NOTE — Progress Notes (Signed)
Spoke with Dr. Burr Medico with Oncology who will see patient in consult during his hospitalization.   Ellouise Newer, PA-C

## 2018-02-05 NOTE — Progress Notes (Signed)
Leroy Harrell  [phone]   HEMATOLOGY ONCOLOGY INPATIENT CONSULTATION   Leroy Harrell  DOB: Aug 14, 1961  MR#: [id number]  CSN#: [id number]    Requesting Physician: Dr.   Patient Care Team: System, Pcp Not In as PCP - General  Reason for consult: Newly diagnosed cholangiocarcinoma   Oncology History   Cancer Staging No matching staging information was found for the patient.       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 As demonstrated on earlier study, there is moderate to severe intrahepatic biliary dilatation with abrupt cut off of the common bile duct in the porta hepatis. There is irregular gallbladder wall thickening, wall calcification and surrounding low-density in the liver. These findings are highly concerning for malignancy, likely cholangiocarcinoma. Gallbladder carcinoma would be an additional consideration. At least 1 enlarged celiac node, suspicious for nodal metastasis. 2. ERCP for tissue sampling and biliary decompression recommended. 3. No evidence of distant metastasis. 4. Possible small infarct or area of inflammation anteriorly in the interpolar region of the right kidney. 5.  Aortic Atherosclerosis (ICD10-I70.0).      02/02/2018 Procedure    ERCP with Dr. Loletha Carrow 02/02/18 IMPRESSION - A severe biliary stricture was found. The stricture was malignant appearing. - The left and right hepatic ducts and all intrahepatic branches were dilated. - One plastic stent was placed into the common bile duct.      02/02/2018 Initial Biopsy    Diagnosis 02/02/18 BILE DUCT BRUSHING (SPECIMEN 1 OF 1 COLLECTED 02/02/18): MALIGNANT CELLS PRESENT, CONSISTENT WITH ADENOCARCINOMA. 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 male is a here because of newly diagnosed  cholangiocarcinoma. The patient was referred by internal medicine. The patient presents to the hospital today by himself.   The pt presented to the ED on 01/31/18 with complaints of RUQ abdominal pain and fatigue/malaise. He notes to have felt mildly nauseous, noticed jaundice, dark colored urine and had subjective fevers and chills. He reports a 30 lbs weight loss and decreased appetite in the past month or so. He was driving down from [place] to [place] but his symptoms worsened therefore came to the hospital for evaluation. His total bilirubin was noted to be 25.5, AST was 139, ALT 144, lipase 41, ALP 525.   Right upper quadrant ultrasound from 01/31/18 revealed dilated CBD with intrahepatic ductal dilation. Severe gallbladder wall thickening up to 10 mm without any cholelithiasis. CT AP W Contrast from 01/31/18 revealed moderate to severe intrahepatic biliary dilatation with abrupt cut off of the common bile duct in the porta hepatis, irregular gallbladder wall thickening, wall calcification and surrounding low-density in the Liver and at least 1 enlarged celiac node, suspicious for nodal metastasis.  Pt underwent an ERCP with Dr. Loletha Carrow on 02/02/18 that revealed a severe biliary stricture that was malignant appearing, and the left and right hepatic ducts and all intrahepatic branches were dilated. One plastic stent was placed into the common bile duct. Biopsy was consistent with adenocarcinoma.   Today, the patient notes he feels fatigued but he is feeling better. He notes to have frequent bowel movements with light color and clay texture.   In the past the patient was diagnosed with significant comorbidities of hypertension that he is currently not taking medications  for. He use to take HCTZ and lisinopril. He does not have a PCP. He denies any previousl surgical history.   He has a FMHx of DM by his father and Alzheimer's and likely breast cancer by his mother. Both are deceased.   Pt smokes  1/2 pack a day of cigarettes. Patient denies any use of Tylenol, NSAIDs. He does admit of drinking heavily occasionally.  He admits of using crack cocaine and marijuana frequently.   Socially he is single and he lived in [place] previously but he does not have a home currently. He is looking for work so he is willing to stay in [place] if he is able to establish care. He has 1 brother who lives in [place] and another brother in [place]. He had a daughter who has passed away.     MEDICAL HISTORY:  Past Medical History:  Diagnosis Date  . Hypertension     SURGICAL HISTORY: Past Surgical History:  Procedure Laterality Date  . ERCP N/A 02/02/2018   Procedure: ENDOSCOPIC RETROGRADE CHOLANGIOPANCREATOGRAPHY (ERCP);  Surgeon: Doran Stabler, MD;  Location: [facility];  Service: Gastroenterology;  Laterality: N/A;    SOCIAL HISTORY: Social History   Socioeconomic History  . Marital status: Single    Spouse name: Not on file  . Number of children: Not on file  . Years of education: Not on file  . Highest education level: Not on file  Occupational History  . Not on file  Social Needs  . Financial resource strain: Not on file  . Food insecurity:    Worry: Not on file    Inability: Not on file  . Transportation needs:    Medical: Not on file    Non-medical: Not on file  Tobacco Use  . Smoking status: Current Every Day Smoker    Packs/day: 0.50    Years: 30.00    Pack years: 15.00    Types: Cigarettes  . Smokeless tobacco: Never Used  Substance and Sexual Activity  . Alcohol use: Yes    Comment: occasionally  . Drug use: Yes    Frequency: 1.0 times per week    Types: Cocaine  . Sexual activity: Not on file  Lifestyle  . Physical activity:    Days per week: Not on file    Minutes per session: Not on file  . Stress: Not on file  Relationships  . Social connections:    Talks on phone: Not on file    Gets together: Not on file    Attends religious service: Not on file     Active member of club or organization: Not on file    Attends meetings of clubs or organizations: Not on file    Relationship status: Not on file  . Intimate partner violence:    Fear of current or ex partner: Not on file    Emotionally abused: Not on file    Physically abused: Not on file    Forced sexual activity: Not on file  Other Topics Concern  . Not on file  Social History Narrative  . Not on file    FAMILY HISTORY: History reviewed. No pertinent family history.  ALLERGIES:  has No Known Allergies.  MEDICATIONS:  Current Facility-Administered Medications  Medication Dose Route Frequency Provider Last Rate Last Dose  . alum & mag hydroxide-simeth (MAALOX/MYLANTA) 200-200-20 MG/5ML suspension 30 mL  30 mL Oral Q4H PRN [name], MD   30 mL at 02/03/18 1444  . amoxicillin-clavulanate (  AUGMENTIN) 875-125 MG per tablet 1 tablet  1 tablet Oral Q12H Patrecia Pour, Christean Grief, MD   1 tablet at 02/05/18 1033  . feeding supplement (ENSURE ENLIVE) (ENSURE ENLIVE) liquid 237 mL  237 mL Oral TID BM [name], MD   237 mL at 02/05/18 1034  . folic acid (FOLVITE) tablet 1 mg  1 mg Oral Daily [name], MD   1 mg at 02/05/18 1035  . guaiFENesin-dextromethorphan (ROBITUSSIN DM) 100-10 MG/5ML syrup 5 mL  5 mL Oral Q4H PRN [name], MD      . heparin injection 5,000 Units  5,000 Units Subcutaneous Q8H [name], MD   5,000 Units at 02/05/18 1412  . HYDROcodone-acetaminophen (NORCO/VICODIN) 5-325 MG per tablet 1-2 tablet  1-2 tablet Oral Q4H PRN Doran Stabler, MD   1 tablet at 01/31/18 1758  . hydrocortisone (ANUSOL-HC) 2.5 % rectal cream 1 application  1 application Topical QID PRN [name], MD      . hydrocortisone cream 1 % 1 application  1 application Topical TID PRN [name], MD      . insulin aspart (novoLOG) injection 0-9 Units  0-9 Units Subcutaneous TID WC [name], MD   1 Units at 02/04/18 1757  . iopamidol  (ISOVUE-300) 61 % injection 30 mL  30 mL Oral Once [name], MD      . lip balm (CARMEX) ointment 1 application  1 application Topical PRN [name], MD      . loratadine (CLARITIN) tablet 10 mg  10 mg Oral Daily PRN [name], MD      . multivitamin with minerals tablet 1 tablet  1 tablet Oral Daily [name], MD   1 tablet at 02/05/18 1033  . MUSCLE RUB CREA 1 application  1 application Topical PRN Danis, Estill Cotta III, MD      . phenol (CHLORASEPTIC) mouth spray 1 spray  1 spray Mouth/Throat PRN Danis, Estill Cotta III, MD      . polyvinyl alcohol (LIQUIFILM TEARS) 1.4 % ophthalmic solution 1 drop  1 drop Both Eyes PRN Danis, Estill Cotta III, MD      . senna-docusate (Senokot-S) tablet 1 tablet  1 tablet Oral QHS PRN [name], MD      . sodium chloride (OCEAN) 0.65 % nasal spray 1 spray  1 spray Each Nare PRN [name], MD      . thiamine (VITAMIN B-1) tablet 100 mg  100 mg Oral Daily [name], MD   100 mg at 02/05/18 1034   Or  . thiamine (B-1) injection 100 mg  100 mg Intravenous Daily Danis, Estill Cotta III, MD        REVIEW OF SYSTEMS:   Constitutional: Denies fevers, chills or abnormal night sweats (+) fatigue (+) 30 lb weight loss (+) decreased appetite Eyes: Denies blurriness of vision, double vision or watery eyes Ears, nose, mouth, throat, and face: Denies mucositis or sore throat Respiratory: Denies cough, dyspnea or wheezes Cardiovascular: Denies palpitation, chest discomfort or lower extremity swelling Gastrointestinal:  Denies nausea, heartburn (+) frequent bowel movement, light clay looking stool  Skin: Denies abnormal skin rashes Lymphatics: Denies new lymphadenopathy or easy bruising Neurological:Denies numbness, tingling or new weaknesses Behavioral/Psych: Mood is stable, no new changes  All other systems were reviewed with the patient and are negative.  PHYSICAL EXAMINATION: ECOG PERFORMANCE STATUS: 1 - Symptomatic  but completely ambulatory  Vitals:   02/04/18 2030 02/05/18 0415  BP: 126/76 (!) 128/94  Pulse: (!) 59 63  Resp: 20 20  Temp: 99.2 F (37.3 C) 98.2 F (36.8 C)  SpO2: 99% 99%   Filed Weights   01/31/18 1253 02/02/18 1140 02/04/18 0532  Weight: 144 lb 13.5 oz (65.7 kg) 144 lb (65.3 kg) 147 lb 11.3 oz (67 kg)    GENERAL:alert, no distress and comfortable SKIN: skin color, texture, turgor are normal, no rashes or significant lesions EYES: normal, conjunctiva are pink and non-injected, sclera clear OROPHARYNX:no exudate, no erythema and lips, buccal mucosa, and tongue normal  NECK: supple, thyroid normal size, non-tender, without nodularity LYMPH:  no palpable lymphadenopathy in the cervical, axillary or inguinal LUNGS: clear to auscultation and percussion with normal breathing effort HEART: regular rate & rhythm and no murmurs and no lower extremity edema ABDOMEN:abdomen soft, (+) RUQ abdominal tenderness. No organomegaly  Musculoskeletal:no cyanosis of digits and no clubbing  PSYCH: alert & oriented x 3 with fluent speech NEURO: no focal motor/sensory deficits  LABORATORY DATA:  I have reviewed the data as listed Lab Results  Component Value Date   WBC 11.5 (H) 02/05/2018   HGB 9.6 (L) 02/05/2018   HCT 29.1 (L) 02/05/2018   MCV 92.4 02/05/2018   PLT 395 02/05/2018   Recent Labs    02/03/18 0429 02/04/18 0534 02/05/18 0455  NA 135 138 136  K 3.8 3.8 3.6  CL 101 104 102  CO2 26 27 26   GLUCOSE 137* 89 94  BUN 13 10 11   CREATININE 0.75 0.75 0.73  CALCIUM 8.7* 8.6* 8.8*  GFRNONAA >60 >60 >60  GFRAA >60 >60 >60  PROT 5.9* 5.7* 5.9*  ALBUMIN 1.9* 1.9* 1.9*  AST 122* 130* 133*  ALT 123* 128* 129*  ALKPHOS 441* 436* 468*  BILITOT 15.8* 12.7* 12.2*   PROCEDURE  ERCP with Dr. Loletha Carrow 02/02/18 FINDINGS The scout film was normal. The esophagus was successfully intubated under direct vision. The scope was advanced to a normal major papilla in the descending duodenum  without detailed examination of the pharynx, larynx and associated structures, and upper GI tract.The major papilla appeared to have sepaate biliary and pancreatic duct openings. The upper GI tract was grossly normal. 0.035 inch x 260 cm straight Hydra Jagwire was passed into the biliary tree. The traction (standard) sphincterotome was passed over the guidewire and the bile duct was then deeply cannulated. Contrast was injected. I personally interpreted the bile duct images. There was brisk flow of contrast through the ducts. Image quality was excellent. Contrast extended to the hepatic ducts. The gallbladder was not visualized. The left and right hepatic ducts and all intrahepatic branches were diffusely dilated. The common hepatic duct and bifurcation contained a single severe stenosis about 20 mm in length. Cells for cytology were obtained by brushing. One 7 Fr by 9 cm plastic stent was placed 8 cm into the bile duct, with the proximal end in the left intrahepatic duct. The stent was in good position. The total fluoroscopy exposure time was 2 minutes and 33 seconds. IMPRESSION - A severe biliary stricture was found. The stricture was malignant appearing. - The left and right hepatic ducts and all intrahepatic branches were dilated. - One plastic stent was placed into the common bile duct.  PATHOLOGY Diagnosis 02/02/18 BILE DUCT BRUSHING (SPECIMEN 1 OF 1 COLLECTED 02/02/18): MALIGNANT CELLS PRESENT, CONSISTENT WITH ADENOCARCINOMA. SEE COMMENT.  COMMENT: DR. Vicente Males HAS REVIEWED THE CASE AND CONCURS WITH THIS INTERPRETATION. DR. Loletha Carrow WAS PAGED ON 3/27/201  RADIOGRAPHIC STUDIES: I have personally reviewed the radiological images as listed and agreed with the findings in the report. US Abdomen Complete  Result Date: 01/31/2018 CLINICAL DATA:  Jaundiced, nauseous EXAM: ABDOMEN ULTRASOUND COMPLETE COMPARISON:  None. FINDINGS: Gallbladder: No cholelithiasis. Gallbladder sludge is present. Severe  gallbladder wall thickening measuring up to 10 mm. Trace pericholecystic fluid. Negative sonographic Murphy sign. Common bile duct: Diameter: 10.2 mm Liver: No focal lesion identified. Within normal limits in parenchymal echogenicity. Moderate intrahepatic biliary ductal dilatation in the right and left hepatic lobes. Portal vein is patent on color Doppler imaging with normal direction of blood flow towards the liver. IVC: No abnormality visualized. Pancreas: Visualized portion unremarkable. Spleen: Size and appearance within normal limits. Right Kidney: Length: 11.2 cm. Echogenicity within normal limits. No mass or hydronephrosis visualized. Left Kidney: Length: 11.2 cm. 1.2 x 1.6 x 1.1 cm anechoic left renal mass most consistent with a cyst. Echogenicity within normal limits. No solid mass or hydronephrosis visualized. Abdominal aorta: No aneurysm visualized. Other findings: None. IMPRESSION: 1. Dilated common bile duct with intrahepatic biliary ductal dilatation of uncertain etiology. No obstructing mass or choledocholithiasis is identified. Recommend further evaluation with MR abdomen/MRCP. 2. Severe gallbladder wall thickening measuring up to 10 mm with a trace amount of pericholecystic fluid without cholelithiasis. This appearance can be seen in the setting of a calculus cholecystitis. Electronically Signed   By: Kathreen Devoid   On: 01/31/2018 09:33   Ct Abdomen Pelvis W Contrast  Result Date: 01/31/2018 CLINICAL DATA:  Abdominal pain and fatigue with biliary dilatation and gallbladder wall thickening ultrasound. Jaundice. EXAM: CT ABDOMEN AND PELVIS WITH CONTRAST TECHNIQUE: Multidetector CT imaging of the abdomen and pelvis was performed using the standard protocol following bolus administration of intravenous contrast. CONTRAST:  12mL ISOVUE-300 IOPAMIDOL (ISOVUE-300) INJECTION 61% COMPARISON:  Ultrasound 01/31/2018. FINDINGS: Lower chest: Mild dependent atelectasis at both lung bases. No significant  pleural or pericardial effusion. Hepatobiliary: As demonstrated on ultrasound, there is diffuse intrahepatic biliary dilatation which is moderate to severe. The common hepatic duct measures up to 12 mm in diameter (coronal image 36/5). There is an abrupt cut off the common bile duct in the porta hepatis, also best seen on the reformatted images. No well-defined mass is seen in this area. The intrapancreatic portion of the common bile duct is normal in caliber. There is diffuse gallbladder wall thickening with wall calcification in the fundal region. There is ill-defined low-density in the liver surrounding the gallbladder. No well-defined gallbladder mass identified. The liver otherwise appears unremarkable. Pancreas: No evidence of pancreatic mass or pancreatic ductal dilatation. No surrounding inflammation. Spleen: Normal in size without focal abnormality. Adrenals/Urinary Tract: Both adrenal glands appear normal. There is a 12 mm cyst in the interpolar region of the left kidney. Anteriorly in the mid right kidney, there is a wedge-shaped area of decreased attenuation on the delayed images (image 19/7). This could reflect an area of inflammation or infarction. No evidence of urinary tract calculus or hydronephrosis. The bladder appears normal. Stomach/Bowel: No evidence of bowel wall thickening, distention or surrounding inflammatory change. The appendix appears normal. Vascular/Lymphatic: Approximately 15 mm celiac node on image 20/2. There are additional smaller nodes in the porta hepatis. No retroperitoneal or pelvic adenopathy. Mild aortic and branch vessel atherosclerosis. The portal, superior mesenteric and splenic veins are patent. Reproductive: Central dystrophic calcifications within the prostate gland. Other:  Trace perihepatic ascites. No peritoneal nodularity. The anterior abdominal wall is intact. Musculoskeletal: No acute or significant osseous findings. IMPRESSION: 1. As demonstrated on earlier  study, there is moderate to severe intrahepatic biliary dilatation with abrupt cut off of the common bile duct in the porta hepatis. There is irregular gallbladder wall thickening, wall calcification and surrounding low-density in the liver. These findings are highly concerning for malignancy, likely cholangiocarcinoma. Gallbladder carcinoma would be an additional consideration. At least 1 enlarged celiac node, suspicious for nodal metastasis. 2. ERCP for tissue sampling and biliary decompression recommended. 3. No evidence of distant metastasis. 4. Possible small infarct or area of inflammation anteriorly in the interpolar region of the right kidney. 5.  Aortic Atherosclerosis (ICD10-I70.0). Electronically Signed   By: Richardean Sale M.D.   On: 01/31/2018 12:35   Dg Ercp  Result Date: 02/02/2018 CLINICAL DATA:  Biliary dilatation.  Obstructive jaundice. EXAM: ERCP TECHNIQUE: Multiple spot images obtained with the fluoroscopic device and submitted for interpretation post-procedure. FLUOROSCOPY TIME:  Fluoroscopy Time:  2 minutes and 28 seconds Number of Acquired Spot Images: 5 COMPARISON:  Abdominal CT 01/31/2018 FINDINGS: Cannulation and opacification of the common bile duct. Wire was advanced into the intrahepatic bile ducts. No significant dilatation of the common bile duct. There is marked dilatation of the intrahepatic ducts. Evidence for a stenosis in the region of the biliary bifurcation and common hepatic duct. A nonmetallic biliary stent was placed. Catheter extends into the intrahepatic biliary system. Partial filling of the cystic duct. IMPRESSION: Biliary obstruction/stenosis near the common hepatic duct and biliary bifurcation. Placement of biliary stent. These images were submitted for radiologic interpretation only. Please see the procedural report for the amount of contrast and the fluoroscopy time utilized. Electronically Signed   By: Markus Daft M.D.   On: 02/02/2018 15:42    ASSESSMENT &  PLAN:  Leroy Harrell 57 y.o. male with a PMHx of HTN. He presented tot he ED with RUQ abdominal pain, fatigue/malaise, jaundice, weight loss and decreased appetite. He underwent an ERCP on 02/02/18 and pathology confirmed cholangiocarcinoma   1. Extrahepatic cholangiocarcinoma, [id number] -I discussed his CT AP findings and pathology results with him in detail. His cytology from biliary stricture is positive for adenocarcina.  His clinical presentation, image findings and biopsy are consistent with extrahepatic cholangiocarcinoma.  His CT abdomen and pelvis was negative for distant metastasis, except a enlarged celiac node, possible nodal metastasis. -I will get a CT chest without contrast to rule out a thoracic metastasis, and complete staging. -I will refer him to see Dr. Barry Dienes to see if he is a candidate for surgery. He is agreeable with this -I also discussed that this type of cancer fairly aggressive with a moderate-high risk for recurrence so he will need adjuvant chemotherapy after surgery, likely Xeloda -If dr. Barry Dienes is not able to remove or biopsy the celiac node, he may need neoadjuvant chemo -His protein level is low and I advised him to focus on his nutrition and getting healthy to be a good candidate for surgery and treatment. I recommend he see our dietician -I strongly advised him to stop smoking cigarettes, drinking alcohol, and use of recreational drugs. He is agreeable.  -I will also speak with social worker, Elmyra Ricks, to see if there is anything they can do to help him with this situation if he chooses to seek treatment in this area.  -I will let our navigator Dawn Placke to f/u his appointments after discharge  -His bilirubin  was 25.5 upon arrival in the ED on 01/31/18, today it is 12.2, steady trending down.  -His CA 19.9 is 41,769. This is suspicious for systemic metastasis -F/u next week in our cancer center   2.  History of hypertension -He ran out of medication, not on  anything now. -We will monitor his blood pressure  3.  Multi-substance abuse (alcohol, smoking, cocaine and marijuana) -I strongly encouraged him to stop alcohol drinking, quit smoking, and do not use any recreational drugs, he agrees -He does not seem to have withdrawal symptoms from alcohol  PLAN: -CT Chest W Contrast today or tomorrow, I have ordered  -Referral to Dr. Barry Dienes, I have sent a message to her  -Dietician and SW referral in our cancer center  -I will speak with social worker, Elmyra Ricks  -OK to discharge after CT chest, from my stand point, I will see him back in my office next week   All questions were answered. The patient knows to call the clinic with any problems, questions or concerns.  This document serves as a record of services personally performed by Truitt Merle, MD. It was created on her behalf by Theresia Bough, a trained medical scribe. The creation of this record is based on the scribe's personal observations and the provider's statements to them.   I have reviewed the above documentation for accuracy and completeness, and I agree with the above.    Truitt Merle, MD 02/05/2018 2:21 PM

## 2018-02-05 NOTE — Progress Notes (Signed)
PROGRESS NOTE    Leroy Harrell  YHC:[id number] DOB: [birth year] DOA: 01/31/2018 PCP: System, Pcp Not In   Brief Narrative:  Leroy Harrell is a 57 year old male with significant history of hypertension, alcohol and tobacco abuse who presented to the emergency department complaining of abdominal pain and fatigue, associated with jaundice and dark urine. Upon initial evaluation patient did not appear to be in acute distress but laboratory finding shows that TBD of 25.5, AST up to 139, ALT 144, ALP 525 with normal INR and PTT.  Alcohol levels were negative.  Right upper quadrant ultrasound showed dilated common bile duct with intrahepatic biliary ductal dilation of unclear etiology. Wall thickening severe gallbladder measuring up to 10 mm without cholelithiasis.  Patient was admitted with working diagnosis of acute alcoholic hepatitis versus acute cholecystitis.  GI was consulted recommended CT of the abdomen which showed intrahepatic biliary dilation with cord of the common bile duct there is irregular gallbladder wall thickening with calcification finding concern of malignancy ?  Cholangiocarcinoma. He underwent ERCP and biliary Brushings revealed Adenocarcinoma. Oncology was consulted for further evaluation and recommendation of Extrahepatic Cholangiocarcinoma.   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 There is irregular gallbladder wall thickening, wall calcification and surrounding low-density in the liver. These findings are highly concerning for  malignancy, likely cholangiocarcinoma. Gallbladder carcinoma would be an additional consideration. At least 1 enlarged celiac node, suspicious for nodal metastasis. -Biliary Brushings were Positive for Adenocarcinoma  -Patient reported weight loss 30 pounds in about 2-3 months. -Status post ERCP with stent placement on CBD 3/24. ERCP showed severe biliary Stricture that was malignant appearing and the Left and Right Hepatic Ducts and all intrahepatic branches were dilated  -Patient initially treated with Zosyn given clinical improvement and afebrile  will de-escalate to Augmentin to complete 10 days of total of therapy will need 4 more days of p.o. antibiotics -Liver enzymes elevated but stable as AST was 133 and ALT was 129 -GI was following and appreciated their evaluation and Recommendations -Medical Oncology consulted for further evaluation as initial diagnosis is Primary Bile Duct Cholangiocarcinoma -Oncology obtaining CT Chest to r/o Thoracic Metastasis -Oncology also consulted General Surgery for evaluation to see if he is a candidate for Surgery and also discussed case for adjuvant Chemotherapy -CA 19.9 is 41,769.  -Per Oncology follow up next week and ok to D/C Home after CT Chest   Hyperbilirubinemia -Patient's TBili improved from 25.5 -> 12.2 -Continue to Monitor and repeat CMP in AM   Normocytic Anemia -Patient's Hb/Hct was 9.6/29.1 -Continue to Monitor for S/Sx of Bleeding -Repeat CBC in AM   History of Hypertension -Blood pressure remains stable -Patient has not been compliant with medication for years -Will continue to monitor off of po Antihypertensives for now as BP is 128/94 -Will add IV Hydralazine for SBP>170 or DBP >100  Hypokalemia -Patient's K+ was 3.6 this AM -Continue to Monitor and Replete as Necessary -Repeat CMP in AM   Alcohol Abuse -No signs of Alcohol Withdrawal currently  -C/w Folic Acid 1 mg po Daily,  MVI 1 tab po Daily, and Thiamine 100 mg po/IV  Daily -Alcohol cessation discussed  Moderate Protein Calorie Malnutrition -Multifactorial (social - EtOH, and nowmalignancy)  -Nutritionist Consulted and appreciated evaluation and Recommendations -C/w Ensure Enlive 237 mL po TIDwm -Dietician referral per Oncology   DVT prophylaxis: Heparin 5,000 units sq q8h Code Status: FULL CODE Family Communication: No family present at bedside  Disposition Plan: Remain Inpatient for continued workup and evaluation by Oncology   Consultants:   Gastroenterology  Medical Oncology   General Surgery   Procedures:  ERCP Findings:      The scout film was normal. The esophagus was successfully intubated       under direct vision. The scope was advanced to a normal major papilla in       the descending duodenum without detailed examination of the pharynx,       larynx and associated structures, and upper GI tract.The major papilla       appeared to have sepaate biliary and pancreatic duct openings. The upper       GI tract was grossly normal. 0.035 inch x 260 cm straight Hydra Jagwire       was passed into the biliary tree. The traction (standard) sphincterotome       was passed over the guidewire and the bile duct was then deeply       cannulated. Contrast was injected. I personally interpreted the bile       duct images. There was brisk flow of contrast through the ducts. Image       quality was excellent. Contrast extended to the hepatic ducts. The       gallbladder was not visualized. The left and right hepatic ducts and all       intrahepatic branches were diffusely dilated. The common hepatic duct       and bifurcation contained a single severe stenosis about 20 mm in       length. Cells for cytology were obtained by brushing. One 7 Fr by 9 cm       plastic stent was placed 8 cm into the bile duct, with the proximal end       in the left intrahepatic duct. The stent was in good position. The total       fluoroscopy exposure time was 2  minutes and 33 seconds. 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                           - The left and right hepatic ducts and all                            intrahepatic branches were dilated.                           - One plastic stent was placed into the common bile                            duct.   Antimicrobials: Anti-infectives (From admission, onward)   Start  Dose/Rate Route Frequency Ordered Stop   02/04/18 2200  amoxicillin-clavulanate (AUGMENTIN) 875-125 MG per tablet 1 tablet     1 tablet Oral Every 12 hours 02/04/18 1713 02/07/18 0959   01/31/18 1800  piperacillin-tazobactam (ZOSYN) IVPB 3.375 g  Status:  Discontinued     3.375 g 12.5 mL/hr over 240 Minutes Intravenous Every 8 hours 01/31/18 1139 02/04/18 1713   01/31/18 0830  piperacillin-tazobactam (ZOSYN) IVPB 3.375 g     3.375 g 100 mL/hr over 30 Minutes Intravenous  Once 01/31/18 0810 01/31/18 1132     Subjective: Seen and examined at bedside and had no complaints. Felt ok but was disheartened to hear about his diagnosis. No CP or SOB.   Objective: Vitals:   02/04/18 0858 02/04/18 1400 02/04/18 2030 02/05/18 0415  BP: 133/61 132/79 126/76 (!) 128/94  Pulse: (!) 51 (!) 53 (!) 59 63  Resp: 14 18 20 20   Temp: 98.8 F (37.1 C) 99.1 F (37.3 C) 99.2 F (37.3 C) 98.2 F (36.8 C)  TempSrc: Oral Oral Oral Oral  SpO2: 100% 100% 99% 99%  Weight:      Height:        Intake/Output Summary (Last 24 hours) at 02/05/2018 1445 Last data filed at 02/05/2018 1000 Gross per 24 hour  Intake 360 ml  Output 650 ml  Net -290 ml   Filed Weights   01/31/18 1253 02/02/18 1140 02/04/18 0532  Weight: 65.7 kg (144 lb 13.5 oz) 65.3 kg (144 lb) 67 kg (147 lb 11.3 oz)   Examination: Physical Exam:  Constitutional: Jaundiced AAM in NAD and appears calm Eyes: Lids and conjunctivae normal, sclerae icteric  ENMT: External Ears,  Nose appear normal. Grossly normal hearing. Mucous membranes are moist.  normal ROM, no appreciable thyromegaly; no JVD Respiratory: Diminished to auscultation bilaterally, no wheezing, rales, rhonchi or crackles. Normal respiratory effort and patient is not tachypenic. No accessory muscle use.  Cardiovascular: RRR, Has a systolic murmur. No extremity edema.  Abdomen: Soft, NT, non-distended. No masses palpated. No appreciable hepatosplenomegaly. Bowel sounds positive x4.  GU: Deferred. Musculoskeletal: No clubbing / cyanosis of digits/nails. No joint deformity upper and lower extremities. .  Skin: Jaundiced. No rashes or lesions on a limited skin eval No induration; Warm and dry.  Neurologic: CN 2-12 grossly intact with no focal deficits. Sensation intact in all 4 Extremities; Romberg sign and cerebellar reflexes not assessed.   Psychiatric: Normal judgment and insight. Alert and oriented x 3. Normal mood and appropriate affect.   Data Reviewed: I have personally reviewed following labs and imaging studies  CBC: Recent Labs  Lab 01/31/18 0544 01/31/18 0956 02/01/18 0453 02/03/18 0429 02/05/18 0455  WBC 10.4 10.5 9.4 15.8* 11.5*  NEUTROABS 7.7  --   --  12.4* 7.4  HGB 10.9* 10.2* 9.6* 9.6* 9.6*  HCT 31.7* 29.6* 28.5* 28.5* 29.1*  MCV 89.8 89.2 88.8 90.2 92.4  PLT 405* 387 401* 402* 628   Basic Metabolic Panel: Recent Labs  Lab 02/01/18 0453 02/02/18 0413 02/03/18 0429 02/04/18 0534 02/05/18 0455  NA 135 136 135 138 136  K 3.4* 3.6 3.8 3.8 3.6  CL 102 102 101 104 102  CO2 24 25 26 27 26   GLUCOSE 99 95 137* 89 94  BUN 10 7 13 10 11   CREATININE 0.62 0.53* 0.75 0.75 0.73  CALCIUM 8.5* 8.9 8.7* 8.6* 8.8*  MG  --  1.9 2.0  --   --    GFR: Estimated Creatinine Clearance: 96.4 mL/min (  by C-G formula based on SCr of 0.73 mg/dL). Liver Function Tests: Recent Labs  Lab 02/01/18 0453 02/02/18 0413 02/03/18 0429 02/04/18 0534 02/05/18 0455  AST 136* 141* 122* 130* 133*  ALT  118* 131* 123* 128* 129*  ALKPHOS 438* 445* 441* 436* 468*  BILITOT 19.8* 19.6* 15.8* 12.7* 12.2*  PROT 5.8* 6.0* 5.9* 5.7* 5.9*  ALBUMIN 1.9* 2.0* 1.9* 1.9* 1.9*   Recent Labs  Lab 01/31/18 0544  LIPASE 41   No results for input(s): AMMONIA in the last 168 hours. Coagulation Profile: Recent Labs  Lab 01/31/18 0544 02/01/18 0453  INR 0.97 1.16   Cardiac Enzymes: No results for input(s): CKTOTAL, CKMB, CKMBINDEX, TROPONINI in the last 168 hours. BNP (last 3 results) No results for input(s): PROBNP in the last 8760 hours. HbA1C: No results for input(s): HGBA1C in the last 72 hours. CBG: Recent Labs  Lab 02/04/18 1145 02/04/18 1632 02/04/18 2028 02/05/18 0745 02/05/18 1140  GLUCAP 101* 121* 101* 88 83   Lipid Profile: No results for input(s): CHOL, HDL, LDLCALC, TRIG, CHOLHDL, LDLDIRECT in the last 72 hours. Thyroid Function Tests: No results for input(s): TSH, T4TOTAL, FREET4, T3FREE, THYROIDAB in the last 72 hours. Anemia Panel: No results for input(s): VITAMINB12, FOLATE, FERRITIN, TIBC, IRON, RETICCTPCT in the last 72 hours. Sepsis Labs: Recent Labs  Lab 01/31/18 0555  LATICACIDVEN 0.65    Recent Results (from the past 240 hour(s))  Culture, blood (routine x 2)     Status: None   Collection Time: 01/31/18  9:57 AM  Result Value Ref Range Status   Specimen Description   Final    BLOOD LEFT FOREARM Performed at [facility] [address] [place], [place] [address]    Special Requests   Final    BOTTLES DRAWN AEROBIC AND ANAEROBIC Blood Culture adequate volume Performed at [facility] [address], [place], [place] [address]    Culture   Final    NO GROWTH 5 DAYS Performed at [facility], [place] [address], [place], [place] [address]    Report Status 02/05/2018 FINAL  Final  Culture, blood (routine x 2)     Status: None   Collection Time: 01/31/18 10:06 AM  Result Value Ref Range Status   Specimen  Description   Final    BLOOD LEFT HAND Performed at [facility] [address] [place], [place] [address]    Special Requests   Final    BOTTLES DRAWN AEROBIC AND ANAEROBIC Blood Culture adequate volume Performed at [facility] [address], [place], [place] [address]    Culture   Final    NO GROWTH 5 DAYS Performed at [facility], [place] [address] [place], [place] [address]    Report Status 02/05/2018 FINAL  Final    Radiology Studies: No results found.  Scheduled Meds: . amoxicillin-clavulanate  1 tablet Oral Q12H  . feeding supplement (ENSURE ENLIVE)  237 mL Oral TID BM  . folic acid  1 mg Oral Daily  . heparin  5,000 Units Subcutaneous Q8H  . insulin aspart  0-9 Units Subcutaneous TID WC  . iopamidol  30 mL Oral Once  . multivitamin with minerals  1 tablet Oral Daily  . thiamine  100 mg Oral Daily   Or  . thiamine  100 mg Intravenous Daily   Continuous Infusions:   LOS: 5 days   Kerney Elbe, DO [facility] Pager [phone]  If 7PM-7AM, please contact night-coverage [email or web address] Password [facility] [address] 02/05/2018, 2:45 PM

## 2018-02-06 LAB — COMPREHENSIVE METABOLIC PANEL
ALK PHOS: 533 U/L — AB (ref 38–126)
ALT: 148 U/L — ABNORMAL HIGH (ref 17–63)
AST: 150 U/L — ABNORMAL HIGH (ref 15–41)
Albumin: 2.3 g/dL — ABNORMAL LOW (ref 3.5–5.0)
Anion gap: 8 (ref 5–15)
BUN: 9 mg/dL (ref 6–20)
CALCIUM: 9 mg/dL (ref 8.9–10.3)
CO2: 28 mmol/L (ref 22–32)
Chloride: 102 mmol/L (ref 101–111)
Creatinine, Ser: 0.68 mg/dL (ref 0.61–1.24)
Glucose, Bld: 97 mg/dL (ref 65–99)
Potassium: 4.1 mmol/L (ref 3.5–5.1)
Sodium: 138 mmol/L (ref 135–145)
Total Bilirubin: 12 mg/dL — ABNORMAL HIGH (ref 0.3–1.2)
Total Protein: 6.6 g/dL (ref 6.5–8.1)

## 2018-02-06 LAB — CBC WITH DIFFERENTIAL/PLATELET
BASOS PCT: 0 %
Basophils Absolute: 0 10*3/uL (ref 0.0–0.1)
EOS PCT: 2 %
Eosinophils Absolute: 0.2 10*3/uL (ref 0.0–0.7)
HCT: 30.8 % — ABNORMAL LOW (ref 39.0–52.0)
HEMOGLOBIN: 10 g/dL — AB (ref 13.0–17.0)
Lymphocytes Relative: 28 %
Lymphs Abs: 3.2 10*3/uL (ref 0.7–4.0)
MCH: 30.8 pg (ref 26.0–34.0)
MCHC: 32.5 g/dL (ref 30.0–36.0)
MCV: 94.8 fL (ref 78.0–100.0)
MONO ABS: 1.1 10*3/uL — AB (ref 0.1–1.0)
Monocytes Relative: 10 %
NEUTROS ABS: 6.9 10*3/uL (ref 1.7–7.7)
NEUTROS PCT: 60 %
PLATELETS: 396 10*3/uL (ref 150–400)
RBC: 3.25 MIL/uL — ABNORMAL LOW (ref 4.22–5.81)
RDW: 20.3 % — ABNORMAL HIGH (ref 11.5–15.5)
WBC: 11.4 10*3/uL — ABNORMAL HIGH (ref 4.0–10.5)

## 2018-02-06 LAB — GLUCOSE, CAPILLARY
GLUCOSE-CAPILLARY: 93 mg/dL (ref 65–99)
Glucose-Capillary: 126 mg/dL — ABNORMAL HIGH (ref 65–99)
Glucose-Capillary: 119 mg/dL — ABNORMAL HIGH (ref 65–99)
Glucose-Capillary: 113 mg/dL — ABNORMAL HIGH (ref 65–99)

## 2018-02-06 LAB — MAGNESIUM: MAGNESIUM: 2.2 mg/dL (ref 1.7–2.4)

## 2018-02-06 LAB — PHOSPHORUS: PHOSPHORUS: 3.7 mg/dL (ref 2.5–4.6)

## 2018-02-06 MED ORDER — BISACODYL 10 MG RE SUPP: 10.0000 mg | Freq: Every day | RECTAL | Status: DC | PRN

## 2018-02-06 MED ORDER — POLYETHYLENE GLYCOL 3350 17 G PO PACK
17.0000 g | PACK | Freq: Two times a day (BID) | ORAL | Status: DC
  Administered 2018-02-06 (×2): 17 g via ORAL
  Filled 2018-02-06 (×3): qty 1

## 2018-02-06 MED ORDER — SENNOSIDES-DOCUSATE SODIUM 8.6-50 MG PO TABS
1.0000 | ORAL_TABLET | Freq: Two times a day (BID) | ORAL | Status: DC
  Administered 2018-02-06: 1 via ORAL
  Filled 2018-02-06 (×2): qty 1

## 2018-02-06 NOTE — Progress Notes (Addendum)
Leroy Harrell   DOB:12/29/1960   WE#:[id number]   YPP#:[id number]  ONCOLOGY F/U NOTE   Subjective: Patient feels well overall, he has decided to go back to [place] for his cancer treatment.  His car is being fixed in the garage today, he would likely leave tomorrow.   Objective:  Vitals:   02/06/18 0500 02/06/18 1359  BP: 127/82 123/66  Pulse: (!) 51 (!) 59  Resp: 18 16  Temp: 98.5 F (36.9 C) 99.2 F (37.3 C)  SpO2: 98% 100%    Body mass index is 23.13 kg/m.  Intake/Output Summary (Last 24 hours) at 02/06/2018 1534 Last data filed at 02/06/2018 1359 Gross per 24 hour  Intake 720 ml  Output -  Net 720 ml     Sclerae unicteric  Oropharynx clear  No peripheral adenopathy  Lungs clear -- no rales or rhonchi  Heart regular rate and rhythm  Abdomen benign  MSK no focal spinal tenderness, no peripheral edema  Neuro nonfocal    CBG (last 3)  Recent Labs    02/06/18 0728 02/06/18 1155 02/06/18 1526  GLUCAP 93 126* 119*     Labs:  Urine Studies No results for input(s): UHGB, CRYS in the last 72 hours.  Invalid input(s): UACOL, UAPR, USPG, UPH, UTP, UGL, UKET, UBIL, UNIT, UROB, ULEU, UEPI, UWBC, [place], [place]  Basic Metabolic Panel: Recent Labs  Lab 02/02/18 0413 02/03/18 0429 02/04/18 0534 02/05/18 0455 02/06/18 0433  NA 136 135 138 136 138  K 3.6 3.8 3.8 3.6 4.1  CL 102 101 104 102 102  CO2 25 26 27 26 28   GLUCOSE 95 137* 89 94 97  BUN 7 13 10 11 9   CREATININE 0.53* 0.75 0.75 0.73 0.68  CALCIUM 8.9 8.7* 8.6* 8.8* 9.0  MG 1.9 2.0  --   --  2.2  PHOS  --   --   --   --  3.7   GFR Estimated Creatinine Clearance: 96.4 mL/min (by C-G formula based on SCr of 0.68 mg/dL). Liver Function Tests: Recent Labs  Lab 02/02/18 0413 02/03/18 0429 02/04/18 0534 02/05/18 0455 02/06/18 0433  AST 141* 122* 130* 133* 150*  ALT 131* 123* 128* 129* 148*  ALKPHOS 445* 441* 436* 468* 533*  BILITOT 19.6* 15.8* 12.7* 12.2* 12.0*  PROT 6.0*  5.9* 5.7* 5.9* 6.6  ALBUMIN 2.0* 1.9* 1.9* 1.9* 2.3*   Recent Labs  Lab 01/31/18 0544  LIPASE 41   No results for input(s): AMMONIA in the last 168 hours. Coagulation profile Recent Labs  Lab 01/31/18 0544 02/01/18 0453  INR 0.97 1.16    CBC: Recent Labs  Lab 01/31/18 0544 01/31/18 0956 02/01/18 0453 02/03/18 0429 02/05/18 0455 02/06/18 0433  WBC 10.4 10.5 9.4 15.8* 11.5* 11.4*  NEUTROABS 7.7  --   --  12.4* 7.4 6.9  HGB 10.9* 10.2* 9.6* 9.6* 9.6* 10.0*  HCT 31.7* 29.6* 28.5* 28.5* 29.1* 30.8*  MCV 89.8 89.2 88.8 90.2 92.4 94.8  PLT 405* 387 401* 402* 395 396   Cardiac Enzymes: No results for input(s): CKTOTAL, CKMB, CKMBINDEX, TROPONINI in the last 168 hours. BNP: Invalid input(s): POCBNP CBG: Recent Labs  Lab 02/05/18 1631 02/05/18 2226 02/06/18 0728 02/06/18 1155 02/06/18 1526  GLUCAP 100* 100* 93 126* 119*   D-Dimer No results for input(s): DDIMER in the last 72 hours. Hgb A1c No results for input(s): HGBA1C in the last 72 hours. Lipid Profile No results for input(s): CHOL, HDL, LDLCALC, TRIG, CHOLHDL, LDLDIRECT  in the last 72 hours. Thyroid function studies No results for input(s): TSH, T4TOTAL, T3FREE, THYROIDAB in the last 72 hours.  Invalid input(s): FREET3 Anemia work up No results for input(s): VITAMINB12, FOLATE, FERRITIN, TIBC, IRON, RETICCTPCT in the last 72 hours. Microbiology Recent Results (from the past 240 hour(s))  Culture, blood (routine x 2)     Status: None   Collection Time: 01/31/18  9:57 AM  Result Value Ref Range Status   Specimen Description   Final    BLOOD LEFT FOREARM Performed at [facility] [address] [place], [place] [address]    Special Requests   Final    BOTTLES DRAWN AEROBIC AND ANAEROBIC Blood Culture adequate volume Performed at [facility] [address], [place], [place] [address]    Culture   Final    NO GROWTH 5 DAYS Performed at [facility],  [place] [address] [place], [place] [address]    Report Status 02/05/2018 FINAL  Final  Culture, blood (routine x 2)     Status: None   Collection Time: 01/31/18 10:06 AM  Result Value Ref Range Status   Specimen Description   Final    BLOOD LEFT HAND Performed at [facility] [address] [place], [place] [address]    Special Requests   Final    BOTTLES DRAWN AEROBIC AND ANAEROBIC Blood Culture adequate volume Performed at [facility] [address], [place], [place] [address]    Culture   Final    NO GROWTH 5 DAYS Performed at [facility], [place] [address], [place], [place] [address]    Report Status 02/05/2018 FINAL  Final      Studies:  Ct Chest Wo Contrast  Result Date: 02/05/2018 CLINICAL DATA:  New diagnosis of cholangiocarcinoma status post ERCP with plastic CBD stent placement. Chest staging. EXAM: CT CHEST WITHOUT CONTRAST TECHNIQUE: Multidetector CT imaging of the chest was performed following the standard protocol without IV contrast. COMPARISON:  01/31/2018 CT abdomen/pelvis. FINDINGS: Cardiovascular: Normal heart size. No significant pericardial fluid/thickening. Mildly atherosclerotic nonaneurysmal thoracic aorta. Normal caliber pulmonary arteries. Mediastinum/Nodes: No discrete thyroid nodules. There is a soft tissue density mass in the subcarinal region measuring 1.7 cm short axis (series 2/image 64), intimately associated with the wall of the midthoracic esophagus, presumably an enlarged paraesophageal node. No axillary adenopathy. Otherwise no pathologically enlarged mediastinal nodes. No discrete hilar adenopathy on these noncontrast images. Lungs/Pleura: No pneumothorax. No pleural effusion. No acute consolidative airspace disease, lung masses or significant pulmonary nodules. Small parenchymal bands in the right lower lobe, compatible with mild scarring or atelectasis. Upper abdomen: Redemonstration diffuse intrahepatic biliary  ductal dilatation and abnormal soft tissue density in the central portal tracts in the visualized liver with stable small volume perihepatic ascites. Expected pneumobilia in the left liver lobe status post reported CBD stent placement. CBD stent is visualized in the medial right upper quadrant on the scout topogram. Musculoskeletal: No aggressive appearing focal osseous lesions. Mild thoracic spondylosis. IMPRESSION: 1. Pathologically enlarged paraesophageal/subcarinal lymph node suspicious for mediastinal nodal metastasis. 2. No additional potential findings of metastatic disease in the chest. 3. Redemonstration of diffuse intrahepatic biliary ductal dilatation, abnormal soft tissue density in the central portal tracts and small volume perihepatic ascites, compatible with known cholangiocarcinoma. Aortic Atherosclerosis (ICD10-I70.0). Electronically Signed   By: Ilona Sorrel M.D.   On: 02/05/2018 15:33    Assessment: Leroy Harrell 57 y.o. male with a PMHx  of HTN. He presented tot he ED with RUQ abdominal pain, fatigue/malaise, jaundice, weight loss and decreased appetite. He underwent an ERCP on 02/02/18 and pathology confirmed cholangiocarcinoma    1. Extrahepatic cholangiocarcinoma, TxNxM1 with node metastasis, stage IV  2.  Obstructive jaundice secondary to #1, status post CBD stent placement 3. Mild anemia likely secondary to malignancy  4. History of HTN, not on meds  5.  Multiple substance abuse (alcohol, smoking, marijuana and cocaine) 5. Social issues: homeless, no insurance, very limited social support    Plan:  -I reviewed his CT chest, which unfortunately showed enlarged subcarinal and periesophageal lymph node, highly suspicious for metastatic disease.  He also has enlarged celiac node, suspicious for metastasis.  I recommend him to get a PET scan as outpatient, or biopsy his subcarinal node through [facility], to confirm the metastasis.  -Pt has decided to return to [place], [place], where he used to live before. He is going to the [facility], tell [phone] -I have personally contacted the [facility] in [facility] and left a message for new referral coordinator Elonda Husky [phone]). I will try to schedule an appointment for him there next week. They do accept pt without insurance. I also give the above info to pt today. I will make sure his medical records to be faxed over there  -He may need assistance for his discharge medication, such as antibiotics.   Truitt Merle, MD 02/06/2018  3:34 PM

## 2018-02-06 NOTE — Progress Notes (Signed)
PROGRESS NOTE    Leroy Harrell  WVP:[id number] DOB: Nov 06, 1961 DOA: 01/31/2018 PCP: System, Pcp Not In   Brief Narrative:  Leroy Harrell is a 57 year old male with significant history of hypertension, alcohol and tobacco abuse who presented to the emergency department complaining of abdominal pain and fatigue, associated with jaundice and dark urine. Upon initial evaluation patient did not appear to be in acute distress but laboratory finding shows that TBD of 25.5, AST up to 139, ALT 144, ALP 525 with normal INR and PTT.  Alcohol levels were negative.  Right upper quadrant ultrasound showed dilated common bile duct with intrahepatic biliary ductal dilation of unclear etiology. Wall thickening severe gallbladder measuring up to 10 mm without cholelithiasis.  Patient was admitted with working diagnosis of acute alcoholic hepatitis versus acute cholecystitis.  GI was consulted recommended CT of the abdomen which showed intrahepatic biliary dilation with cord of the common bile duct there is irregular gallbladder wall thickening with calcification finding concern of malignancy ?  Cholangiocarcinoma. He underwent ERCP and biliary Brushings revealed Adenocarcinoma. Oncology was consulted for further evaluation and recommendation of Extrahepatic Cholangiocarcinoma. Oncology recommended CT Chest to evaluate for Metastasis and was highly suspicious for Metastatic Disease. Oncology recommended PET scan as outpatient or a Biopsy through [facility]. Patient has decided to return to [facility] and will be going to [employer] [facility]. Oncology is arranging referral to [facility] in [place]. Dr. Burr Medico Discussed Case with Dr. Barry Dienes who states he will need [facility] for Surgery. Social work arranging getting the patient back to [facility] tomorrow.   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 There is irregular gallbladder wall thickening, wall calcification and surrounding low-density in the liver. These findings are highly concerning for malignancy, likely cholangiocarcinoma. Gallbladder carcinoma would be an additional consideration. At least 1 enlarged celiac node, suspicious for nodal metastasis. -Biliary Brushings were Positive for Adenocarcinoma  -Patient reported weight loss 30 pounds in about 2-3 months. -Status post ERCP with stent placement on CBD 3/24. ERCP showed severe biliary Stricture that was malignant appearing and the Left and Right Hepatic Ducts and all intrahepatic branches were dilated  -Patient initially treated with Zosyn given clinical improvement and afebrile  will de-escalate to Augmentin to complete 10 days of total of therapy will need 3 more days of p.o. antibiotics -Liver enzymes elevated but stable as AST was 150 and ALT was 148 -GI was following and appreciated their evaluation and Recommendations -Medical Oncology consulted for further evaluation as initial diagnosis is Primary Bile Duct Cholangiocarcinoma -Oncology obtained CT Chest and it showed suspected Metastatic Disease -Oncology also consulted General Surgery for evaluation to see if he is a candidate for Surgery and also discussed case for adjuvant Chemotherapy; General Surgery stated he will need a [facility] for Surgery -CA 19.9 is 41,769.  -Patient has decided to go back to [facility] for Care; Please see discussion above but Outpatient Referral has been made to [facility] in [facility] and will try to schedule patient next week.  Hyperbilirubinemia -Patient's TBili improved from 25.5 -> 12.0 -Continue to Monitor and repeat CMP in AM   Normocytic Anemia -Patient's Hb/Hct was 10.0/30.8 -Continue to Monitor for S/Sx of Bleeding -Repeat CBC in AM   History of Hypertension -Blood pressure remains stable -Patient has not been compliant with medication for years -Will continue to monitor off of po Antihypertensives for now as BP is 123/66 -Will add IV Hydralazine for SBP>170 or DBP >100  Hypokalemia -Patient's K+ was 4.1 this AM -Continue to Monitor and Replete as Necessary -Repeat CMP in AM   Alcohol Abuse -No signs of Alcohol Withdrawal currently  -C/w Folic Acid 1 mg po Daily, MVI 1 tab po Daily, and Thiamine 100 mg po/IV Daily -Alcohol cessation discussed  Moderate Protein Calorie Malnutrition -Multifactorial (social - EtOH, and nowmalignancy)  -Nutritionist Consulted and appreciated evaluation and Recommendations -C/w Ensure Enlive 237 mL po TIDwm -Dietician referral per Oncology   DVT prophylaxis: Heparin 5,000 units sq q8h Code Status: FULL CODE Family Communication: No family present at bedside  Disposition Plan: Difficult Social Situation that patient is Homeless. After discussing with Social Worker patient has decided to return to [facility] for Care and will apply for Medicaid there. He will need to follow up with the [facility] there for completion of workup and outpatient PET Scan as well as be referred to a [facility] with a Biliary Cancer Specialist.   Consultants:   Gastroenterology  Medical Oncology   General Surgery   Social Work   Procedures:  ERCP Findings:      The scout film was normal. The esophagus was successfully intubated       under direct vision. The scope was advanced to a normal major papilla in       the descending duodenum without detailed examination of the pharynx,       larynx and associated structures, and upper  GI tract.The major papilla       appeared to have sepaate biliary and pancreatic duct openings. The upper       GI tract was grossly normal. 0.035 inch x 260 cm straight Hydra Jagwire       was passed into the biliary tree. The traction (standard) sphincterotome       was passed over the guidewire and the bile duct was then deeply       cannulated. Contrast was injected. I personally interpreted the bile       duct images. There was brisk flow of contrast through the ducts. Image       quality was excellent. Contrast extended to the hepatic ducts. The       gallbladder was not visualized. The left and right hepatic ducts and all       intrahepatic branches were diffusely dilated. The common hepatic duct       and bifurcation contained a single severe stenosis about 20 mm in       length. Cells for cytology were obtained by brushing. One 7 Fr by 9 cm       plastic stent was placed 8 cm into the bile duct, with the proximal end       in the left intrahepatic duct. The stent was in good position. The total       fluoroscopy exposure time was 2 minutes and 33 seconds. 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 The  stricture was malignant appearing.                           - The left and right hepatic ducts and all                            intrahepatic branches were dilated.                           - One plastic stent was placed into the common bile                            duct.   Antimicrobials: Anti-infectives (From admission, onward)   Start     Dose/Rate Route Frequency Ordered Stop   02/04/18 2200  amoxicillin-clavulanate (AUGMENTIN) 875-125 MG per tablet 1 tablet     1 tablet Oral Every 12 hours 02/04/18 1713 02/07/18 0959   01/31/18 1800  piperacillin-tazobactam (ZOSYN) IVPB 3.375 g  Status:  Discontinued     3.375 g 12.5 mL/hr over 240 Minutes Intravenous Every 8 hours 01/31/18 1139 02/04/18 1713   01/31/18 0830   piperacillin-tazobactam (ZOSYN) IVPB 3.375 g     3.375 g 100 mL/hr over 30 Minutes Intravenous  Once 01/31/18 0810 01/31/18 1132     Subjective: Seen and examined at bedside and felt ok and was walking around the halls with no issues. Patient is Homeless and has no support here so he has decided to return to [facility]. Had issues with his Car so social work has helped with that. Plan is to be D/C'd tomorrow. He denied any CP or SOB. He spoke to Dr. Burr Medico who is arranging outpatient follow up with [facility] in [place].   Objective: Vitals:   02/05/18 1400 02/05/18 2228 02/06/18 0500 02/06/18 1359  BP: 134/73 126/74 127/82 123/66  Pulse: (!) 55 (!) 59 (!) 51 (!) 59  Resp: 18 18 18 16   Temp: 99.6 F (37.6 C) 98.8 F (37.1 C) 98.5 F (36.9 C) 99.2 F (37.3 C)  TempSrc: Oral Oral Oral Oral  SpO2: 96% 99% 98% 100%  Weight:      Height:        Intake/Output Summary (Last 24 hours) at 02/06/2018 2015 Last data filed at 02/06/2018 1846 Gross per 24 hour  Intake 360 ml  Output -  Net 360 ml   Filed Weights   01/31/18 1253 02/02/18 1140 02/04/18 0532  Weight: 65.7 kg (144 lb 13.5 oz) 65.3 kg (144 lb) 67 kg (147 lb 11.3 oz)   Examination: Physical Exam:  Constitutional: Jaundiced AAM in NAD appears calm  Eyes: Sclerae are icteric. Lids and Conjunctivae normal ENMT: External Ears and nose appear normal. Grossly normal hearing. MMM Respiratory: Diminished but unlabored breathing. No appreciable wheezing/rales/rhonchi Cardiovascular: RRR; Has a systolic murmur. No Extremity edema Abdomen: Soft, Slightly tender to palpation, ND. Bowel sounds present  GU: Deferred Musculoskeletal: No contractures; No cyanosis Skin: Warm and Dry; Jaundiced appearance. No rashes or lesions on a limited skin eval Neurologic: CN 2-12 grossly intact. No appreciable focal deficits Psychiatric: Normal mood and affect. Intact judgement and insight  Data Reviewed: I have personally reviewed following labs  and imaging studies  CBC: Recent Labs  Lab 01/31/18 0544 01/31/18 0956 02/01/18 0453 02/03/18 0429 02/05/18 0455 02/06/18 0433  WBC 10.4 10.5 9.4 15.8* 11.5*  11.4*  NEUTROABS 7.7  --   --  12.4* 7.4 6.9  HGB 10.9* 10.2* 9.6* 9.6* 9.6* 10.0*  HCT 31.7* 29.6* 28.5* 28.5* 29.1* 30.8*  MCV 89.8 89.2 88.8 90.2 92.4 94.8  PLT 405* 387 401* 402* 395 702   Basic Metabolic Panel: Recent Labs  Lab 02/02/18 0413 02/03/18 0429 02/04/18 0534 02/05/18 0455 02/06/18 0433  NA 136 135 138 136 138  K 3.6 3.8 3.8 3.6 4.1  CL 102 101 104 102 102  CO2 25 26 27 26 28   GLUCOSE 95 137* 89 94 97  BUN 7 13 10 11 9   CREATININE 0.53* 0.75 0.75 0.73 0.68  CALCIUM 8.9 8.7* 8.6* 8.8* 9.0  MG 1.9 2.0  --   --  2.2  PHOS  --   --   --   --  3.7   GFR: Estimated Creatinine Clearance: 96.4 mL/min (by C-G formula based on SCr of 0.68 mg/dL). Liver Function Tests: Recent Labs  Lab 02/02/18 0413 02/03/18 0429 02/04/18 0534 02/05/18 0455 02/06/18 0433  AST 141* 122* 130* 133* 150*  ALT 131* 123* 128* 129* 148*  ALKPHOS 445* 441* 436* 468* 533*  BILITOT 19.6* 15.8* 12.7* 12.2* 12.0*  PROT 6.0* 5.9* 5.7* 5.9* 6.6  ALBUMIN 2.0* 1.9* 1.9* 1.9* 2.3*   Recent Labs  Lab 01/31/18 0544  LIPASE 41   No results for input(s): AMMONIA in the last 168 hours. Coagulation Profile: Recent Labs  Lab 01/31/18 0544 02/01/18 0453  INR 0.97 1.16   Cardiac Enzymes: No results for input(s): CKTOTAL, CKMB, CKMBINDEX, TROPONINI in the last 168 hours. BNP (last 3 results) No results for input(s): PROBNP in the last 8760 hours. HbA1C: No results for input(s): HGBA1C in the last 72 hours. CBG: Recent Labs  Lab 02/05/18 1631 02/05/18 2226 02/06/18 0728 02/06/18 1155 02/06/18 1526  GLUCAP 100* 100* 93 126* 119*   Lipid Profile: No results for input(s): CHOL, HDL, LDLCALC, TRIG, CHOLHDL, LDLDIRECT in the last 72 hours. Thyroid Function Tests: No results for input(s): TSH, T4TOTAL, FREET4, T3FREE,  THYROIDAB in the last 72 hours. Anemia Panel: No results for input(s): VITAMINB12, FOLATE, FERRITIN, TIBC, IRON, RETICCTPCT in the last 72 hours. Sepsis Labs: Recent Labs  Lab 01/31/18 0555  LATICACIDVEN 0.65    Recent Results (from the past 240 hour(s))  Culture, blood (routine x 2)     Status: None   Collection Time: 01/31/18  9:57 AM  Result Value Ref Range Status   Specimen Description   Final    BLOOD LEFT FOREARM Performed at [facility] [address] [place], [place] [address]    Special Requests   Final    BOTTLES DRAWN AEROBIC AND ANAEROBIC Blood Culture adequate volume Performed at [facility] [address] [place], [place] [address]    Culture   Final    NO GROWTH 5 DAYS Performed at [facility], [place] [address] [place], [place] [address]    Report Status 02/05/2018 FINAL  Final  Culture, blood (routine x 2)     Status: None   Collection Time: 01/31/18 10:06 AM  Result Value Ref Range Status   Specimen Description   Final    BLOOD LEFT HAND Performed at [facility] [address], [place], [place] [address]    Special Requests   Final    BOTTLES DRAWN AEROBIC AND ANAEROBIC Blood Culture adequate volume Performed at [facility] [address]  [place], [place] [address]    Culture   Final    NO GROWTH 5 DAYS Performed at [facility], [place] [address], [place], [place] [address]    Report Status 02/05/2018 FINAL  Final    Radiology Studies: Ct Chest Wo Contrast  Result Date: 02/05/2018 CLINICAL DATA:  New diagnosis of cholangiocarcinoma status post ERCP with plastic CBD stent placement. Chest staging. EXAM: CT CHEST WITHOUT CONTRAST TECHNIQUE: Multidetector CT imaging of the chest was performed following the standard protocol without IV contrast. COMPARISON:  01/31/2018 CT abdomen/pelvis. FINDINGS: Cardiovascular: Normal heart size. No significant pericardial  fluid/thickening. Mildly atherosclerotic nonaneurysmal thoracic aorta. Normal caliber pulmonary arteries. Mediastinum/Nodes: No discrete thyroid nodules. There is a soft tissue density mass in the subcarinal region measuring 1.7 cm short axis (series 2/image 64), intimately associated with the wall of the midthoracic esophagus, presumably an enlarged paraesophageal node. No axillary adenopathy. Otherwise no pathologically enlarged mediastinal nodes. No discrete hilar adenopathy on these noncontrast images. Lungs/Pleura: No pneumothorax. No pleural effusion. No acute consolidative airspace disease, lung masses or significant pulmonary nodules. Small parenchymal bands in the right lower lobe, compatible with mild scarring or atelectasis. Upper abdomen: Redemonstration diffuse intrahepatic biliary ductal dilatation and abnormal soft tissue density in the central portal tracts in the visualized liver with stable small volume perihepatic ascites. Expected pneumobilia in the left liver lobe status post reported CBD stent placement. CBD stent is visualized in the medial right upper quadrant on the scout topogram. Musculoskeletal: No aggressive appearing focal osseous lesions. Mild thoracic spondylosis. IMPRESSION: 1. Pathologically enlarged paraesophageal/subcarinal lymph node suspicious for mediastinal nodal metastasis. 2. No additional potential findings of metastatic disease in the chest. 3. Redemonstration of diffuse intrahepatic biliary ductal dilatation, abnormal soft tissue density in the central portal tracts and small volume perihepatic ascites, compatible with known cholangiocarcinoma. Aortic Atherosclerosis (ICD10-I70.0). Electronically Signed   By: Ilona Sorrel M.D.   On: 02/05/2018 15:33    Scheduled Meds: . amoxicillin-clavulanate  1 tablet Oral Q12H  . feeding supplement (ENSURE ENLIVE)  237 mL Oral TID BM  . folic acid  1 mg Oral Daily  . heparin  5,000 Units Subcutaneous Q8H  . insulin aspart  0-9  Units Subcutaneous TID WC  . iopamidol  30 mL Oral Once  . multivitamin with minerals  1 tablet Oral Daily  . polyethylene glycol  17 g Oral BID  . senna-docusate  1 tablet Oral BID  . thiamine  100 mg Oral Daily   Or  . thiamine  100 mg Intravenous Daily   Continuous Infusions:   LOS: 6 days   Kerney Elbe, DO [facility] Pager [phone]  If 7PM-7AM, please contact night-coverage [email or web address] Password [email or web address] 02/06/2018, 8:15 PM

## 2018-02-06 NOTE — Clinical Social Work Note (Addendum)
Clinical Social Work Assessment  Patient Details  Name: Leroy Harrell MRN: [id number] Date of Birth: Apr 23, 1961  Date of referral:  02/06/18               Reason for consult:  Facility Placement                Permission sought to share information with:  Family Supports, Chartered certified accountant granted to share information::  Yes, Verbal Permission Granted  Name::        Agency::     Relationship::     Contact Information:     Housing/Transportation Living arrangements for the past 2 months:  Single Family Home, [place] of Information:  Patient Patient Interpreter Needed:  None Criminal Activity/Legal Involvement Pertinent to Current Situation/Hospitalization:  No - Comment as needed Significant Relationships:  Siblings, Friend Lives with:  Self Do you feel safe going back to the place where you live?  Yes Need for family participation in patient care:  No  Care giving concerns:   Patient is from [facility], homeless and will need further medical work up at cancer treatment center.  Patient does not have local family or friends.  Patient will need a shelter.   Social Worker assessment / plan: 3/27:CSW met with the patient at bedside, explained role and reason for visit- to assist with shelter resources. The patient is agreeable to discuss discharge plans. Patient reports he has been proactive with searching for a shelter. Patient reports he called English as a second language teacher to inquire about bed availability, at this time they are at capacity. CSW provided the patient a list of shelters, he patient prefers to stay local. CSW informed patient he will need to call the shelter until a bed is available. CSW provided food resources and financial assistance programs.  CSW will Development worker, international aid of Social Work to inquire about the [employer].   3/28 10:45am- Patient reports at this time he prefers to return back to [place] to follow up with  an oncology specialist . He reports feeling "better about going where I am most familiar." He reports he is familiar with hospital and community resources there. He reports receiving food stamps and will attempt to apply for medicaid there, something he has not done.  Patient informed CSW that he feels safe driving back but does not have funds for gas and has been using a spare tire on his car. He is unsure how long the spare tire will hold up.   Re: -with the patient permission CSW called and discussed case with CSW Director [facility]. CSW explained patient situation.  -Engineer, agricultural agreeable to purchase a tire for the patient and provide funds for food and gas. -CSW called shelter to determine bed availability-patient reports he is familiar with the [facility] and [facility]. (Patient plans to stand in line for bed at [facility] or go back to [facility])  Plan: Shelter at discharge in [place]. CSW will provide funds for gas and food and receipt in the am 3/29 for patient to pick up tire for his car. Physician has been notified.     Employment status:  Unemployed Forensic scientist:  Self Pay (Medicaid Pending) PT Recommendations:  Not assessed at this time- Patient walking without support or assistance.  Information / Referral to community resources:     Patient/Family's Response to care:  Patient appreciative of CSW visit.   Patient/Family's Understanding of and Emotional  Response to Diagnosis, Current Treatment, and Prognosis: Patient express concerns about his diagnosis and treatment. He has minimal family support, finances and express uncertainty about his future and making [place] his home.    Emotional Assessment Appearance:  Appears stated age Attitude/Demeanor/Rapport:    Affect (typically observed):  Accepting, Pleasant Orientation:  Oriented to Self, Oriented to Place, Oriented to  Time, Oriented to Situation Alcohol / Substance  use:  Not Applicable Psych involvement (Current and /or in the community):  No (Comment)  Discharge Needs  Concerns to be addressed:  Discharge Planning Concerns, Decision making concerns Readmission within the last 30 days:  No Current discharge risk:  Lack of support system Barriers to Discharge:  Continued Medical Work up, Homeless with medical needs   Lia Hopping, LCSW 02/06/2018, 8:33 AM

## 2018-02-07 LAB — CBC WITH DIFFERENTIAL/PLATELET
BASOS PCT: 0 %
Basophils Absolute: 0 10*3/uL (ref 0.0–0.1)
EOS ABS: 0.2 10*3/uL (ref 0.0–0.7)
EOS PCT: 2 %
HCT: 28.3 % — ABNORMAL LOW (ref 39.0–52.0)
HEMOGLOBIN: 9.2 g/dL — AB (ref 13.0–17.0)
Lymphocytes Relative: 27 %
Lymphs Abs: 2.9 10*3/uL (ref 0.7–4.0)
MCH: 31 pg (ref 26.0–34.0)
MCHC: 32.5 g/dL (ref 30.0–36.0)
MCV: 95.3 fL (ref 78.0–100.0)
Monocytes Absolute: 1.3 10*3/uL — ABNORMAL HIGH (ref 0.1–1.0)
Monocytes Relative: 12 %
NEUTROS PCT: 59 %
Neutro Abs: 6.2 10*3/uL (ref 1.7–7.7)
Platelets: 380 10*3/uL (ref 150–400)
RBC: 2.97 MIL/uL — AB (ref 4.22–5.81)
RDW: 20.3 % — ABNORMAL HIGH (ref 11.5–15.5)
WBC: 10.6 10*3/uL — ABNORMAL HIGH (ref 4.0–10.5)

## 2018-02-07 LAB — COMPREHENSIVE METABOLIC PANEL
ALBUMIN: 2 g/dL — AB (ref 3.5–5.0)
ALT: 141 U/L — ABNORMAL HIGH (ref 17–63)
ANION GAP: 7 (ref 5–15)
AST: 137 U/L — ABNORMAL HIGH (ref 15–41)
Alkaline Phosphatase: 509 U/L — ABNORMAL HIGH (ref 38–126)
BILIRUBIN TOTAL: 10.7 mg/dL — AB (ref 0.3–1.2)
BUN: 8 mg/dL (ref 6–20)
CHLORIDE: 103 mmol/L (ref 101–111)
CO2: 28 mmol/L (ref 22–32)
Calcium: 8.8 mg/dL — ABNORMAL LOW (ref 8.9–10.3)
Creatinine, Ser: 0.7 mg/dL (ref 0.61–1.24)
GFR calc Af Amer: >60 mL/min (ref >60)
Glucose, Bld: 95 mg/dL (ref 65–99)
Potassium: 3.9 mmol/L (ref 3.5–5.1)
SODIUM: 138 mmol/L (ref 135–145)
TOTAL PROTEIN: 6 g/dL — AB (ref 6.5–8.1)

## 2018-02-07 LAB — PHOSPHORUS: Phosphorus: 3.8 mg/dL (ref 2.5–4.6)

## 2018-02-07 LAB — GLUCOSE, CAPILLARY: GLUCOSE-CAPILLARY: 129 mg/dL — AB (ref 65–99)

## 2018-02-07 LAB — MAGNESIUM: MAGNESIUM: 2.1 mg/dL (ref 1.7–2.4)

## 2018-02-07 MED ORDER — ENSURE ENLIVE PO LIQD: 237.0000 mL | Freq: Three times a day (TID) | ORAL | 12 refills | Status: AC

## 2018-02-07 MED ORDER — ADULT MULTIVITAMIN W/MINERALS CH
1.0000 | ORAL_TABLET | Freq: Every day | ORAL | 0 refills | Status: AC
Start: 2018-02-07 — End: ?

## 2018-02-07 MED ORDER — POLYETHYLENE GLYCOL 3350 17 G PO PACK: 17.0000 g | PACK | Freq: Every day | ORAL | 0 refills | Status: AC | PRN

## 2018-02-07 MED ORDER — SENNOSIDES-DOCUSATE SODIUM 8.6-50 MG PO TABS: 1.0000 | ORAL_TABLET | Freq: Two times a day (BID) | ORAL | 0 refills | Status: DC

## 2018-02-07 MED ORDER — AMOXICILLIN-POT CLAVULANATE 875-125 MG PO TABS
1.0000 | ORAL_TABLET | Freq: Two times a day (BID) | ORAL | 0 refills | Status: AC
Start: 2018-02-07 — End: ?

## 2018-02-07 MED ORDER — FOLIC ACID 1 MG PO TABS: 1.0000 mg | ORAL_TABLET | Freq: Every day | ORAL | 0 refills | Status: AC

## 2018-02-07 MED ORDER — SENNOSIDES-DOCUSATE SODIUM 8.6-50 MG PO TABS: 1.0000 | ORAL_TABLET | Freq: Every evening | ORAL | 0 refills | Status: AC | PRN

## 2018-02-07 MED ORDER — POLYETHYLENE GLYCOL 3350 17 G PO PACK
17.0000 g | PACK | Freq: Two times a day (BID) | ORAL | 0 refills | Status: DC
Start: 2018-02-07 — End: 2018-02-07

## 2018-02-07 MED ORDER — AMOXICILLIN-POT CLAVULANATE 875-125 MG PO TABS
1.0000 | ORAL_TABLET | Freq: Two times a day (BID) | ORAL | Status: DC
  Administered 2018-02-07: 1 via ORAL
  Filled 2018-02-07: qty 1

## 2018-02-07 MED ORDER — HYDROCORTISONE 2.5 % RE CREA: 1.0000 "application " | TOPICAL_CREAM | Freq: Four times a day (QID) | RECTAL | 0 refills | Status: AC | PRN

## 2018-02-07 MED ORDER — THIAMINE HCL 100 MG PO TABS: 100.0000 mg | ORAL_TABLET | Freq: Every day | ORAL | 0 refills | Status: AC

## 2018-02-07 MED FILL — AMOX TR-K CLV 875-125 MG TA: 875-125 | 3 days supply | Qty: 6 | Fill #0

## 2018-02-07 MED FILL — FOLIC ACID 1 MG TABLET: 1 | 30 days supply | Qty: 30 | Fill #0

## 2018-02-07 NOTE — Progress Notes (Signed)
Pt was given discharge instructions, questions were answered, prescription was given to patient as well as receipt and $50. Patient was taken to main entrance by NT.

## 2018-02-07 NOTE — Discharge Summary (Signed)
Physician Discharge Summary  Leroy Harrell WUJ:[id number] DOB: May 17, 1961 DOA: 01/31/2018  PCP: System, Pcp Not In  Admit date: 01/31/2018 Discharge date: 02/07/2018  Admitted From: Homeless Disposition:  Homeless Shelter in [facility]  Recommendations for Outpatient Follow-up:  1. Follow up and establish with PCP in 1-2 weeks 2. Follow up with [facility] in [facility] and contact [facility] [phone] 3. Follow up at [facility] in [facility] 4. Follow up with Biliary General Surgeon in [facility] 5. Please obtain CMP/CBC, Mag, Phos in one week 6. Please follow up on the following pending results:  Home Health: No Equipment/Devices: None  Discharge Condition: Stable CODE STATUS: FULL CODE Diet recommendation: Heart Healthy Soft Diet  Brief/Interim Summary: Leroy Bentler Adamsis a 57 year old male with significant history of hypertension, alcohol and tobacco abuse who presented to the emergency department complaining of abdominal pain and fatigue, associated with jaundice and dark urine. Upon initial evaluation patient did not appear to be in acute distress but laboratory finding shows that TBD of 25.5, AST up to 139, ALT 144, ALP 525 with normal INR and PTT. Alcohol levels were negative. Right upper quadrant ultrasound showed dilated common bile duct with intrahepatic biliary ductal dilation of unclear etiology. Wall thickening severe gallbladder measuring up to 10 mm without cholelithiasis. Patient was admitted with working diagnosis of acute alcoholic hepatitis versus acute cholecystitis. GI was consulted recommended CT of the abdomen which showed intrahepatic biliary dilation with cord of the common bile duct there is irregular gallbladder wall thickening with calcification finding concern of malignancy ? Cholangiocarcinoma. He underwent ERCP and biliary Brushings revealed Adenocarcinoma. Oncology was consulted for further evaluation and recommendation  of Extrahepatic Cholangiocarcinoma. Oncology recommended CT Chest to evaluate for Metastasis and was highly suspicious for Metastatic Disease. Oncology recommended PET scan as outpatient or a Biopsy through [facility] but will not be done here as patient felt his best family support is in [facility].  Patient has decided to return to [facility] and will be going to [employer] or [employer]. Oncology is arranging referral to [facility] in [place]. Dr. Burr Medico Discussed Case with Dr. Barry Dienes who states he will need [facility] for Surgery. Social work arranging getting the patient back to [facility] today. He will follow up at a [facility] in [place] and call for follow up appointments. He was deemed medically stable to D/C back to [place].   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 There is irregular gallbladder wall thickening, wall calcification and surrounding low-density in the liver. These findings are highly concerning for malignancy, likely cholangiocarcinoma. Gallbladder carcinoma would be an additional consideration. At least 1 enlarged celiac node, suspicious for nodal metastasis. -Biliary Brushings were Positive for Adenocarcinoma  -Patient reported weight loss 30 pounds in about 2-3 months. -Status post ERCP with stent placement on CBD 3/24. ERCP showed severe biliary Stricture that was malignant appearing and the Left and Right Hepatic Ducts and all intrahepatic branches were dilated  -Patient  initially treated with Zosyn given  clinical improvement and afebrile will de-escalate to Augmentin to complete 10 days of total of therapy will need 3 more days of p.o. antibiotics -Liver enzymes elevated but stable as AST was 137 and ALT was 141 -GI was following and appreciated their evaluation and Recommendations -Medical Oncology consulted for further evaluation as initial diagnosis is Primary Bile Duct Cholangiocarcinoma -Oncology obtained CT Chest and it showed suspected Metastatic Disease -Oncology also consulted General Surgery for evaluation to see if he is a candidate for Surgery and also discussed case for adjuvant Chemotherapy; General Surgery stated he will need a [facility] for Surgery -CA 19.9 is 41,769.  -Patient has decided to go back to [facility] for Care; Please see discussion above but Outpatient Referral has been made to [facility] in [facility] and will try to schedule patient next week.  -Patient to follow up at [facility] in [place].   Hyperbilirubinemia -Patient's TBili improved from 25.5 -> 10.7 -Continue to Monitor and repeat CMP in AM   Normocytic Anemia -Patient's Hb/Hct was 9.2/28.3 -Continue to Monitor for S/Sx of Bleeding -Repeat CBC as an outpatient   History of Hypertension -Blood pressure remains stable -Patient has not been compliant with medication for years -Will continue to monitor off of po Antihypertensives for now as BP is 131/83 -Added IV Hydralazine for SBP>170 or DBP >100 while hospitalized   Hypokalemia -Patient's K+ was 3.9 this AM -Continue to Monitor and Replete as Necessary -Repeat CMP in AM   Alcohol Abuse -No signs of Alcohol Withdrawal currently  -C/w Folic Acid 1 mg po Daily, MVI 1 tab po Daily, and Thiamine 100 mg po Daily at D/C -Alcohol cessation discussed  Moderate Protein Calorie Malnutrition -Multifactorial (social - EtOH, and nowmalignancy)  -Nutritionist Consulted and appreciated  evaluation and Recommendations -C/w Ensure Enlive 237 mL po TIDwm at D/C  Discharge Instructions  Discharge Instructions    Call MD for:  difficulty breathing, headache or visual disturbances   Complete by:  As directed    Call MD for:  extreme fatigue   Complete by:  As directed    Call MD for:  hives   Complete by:  As directed    Call MD for:  persistant dizziness or light-headedness   Complete by:  As directed    Call MD for:  persistant nausea and vomiting   Complete by:  As directed    Call MD for:  redness, tenderness, or signs of infection (pain, swelling, redness, odor or green/yellow discharge around incision site)   Complete by:  As directed    Call MD for:  severe uncontrolled pain   Complete by:  As directed    Call MD for:  temperature >100.4   Complete by:  As directed    Diet - low sodium heart healthy   Complete by:  As directed    Discharge instructions   Complete by:  As directed    Follow up and establish with PCP, Oncology and General Surgery in [facility]. Take all medications as prescribed and stay away from illicit substances. If symptoms change or worsen please return to the ED in [facility] for evaluation.   Increase activity slowly   Complete by:  As directed      Allergies as of 02/07/2018   No Known Allergies     Medication List    TAKE these medications   amoxicillin-clavulanate 875-125 MG tablet Commonly known as:  AUGMENTIN Take 1 tablet by mouth every 12 (  twelve) hours.   feeding supplement (ENSURE ENLIVE) Liqd Take 237 mLs by mouth 3 (three) times daily between meals.   folic acid 1 MG tablet Commonly known as:  FOLVITE Take 1 tablet (1 mg total) by mouth daily.   hydrocortisone 2.5 % rectal cream Commonly known as:  ANUSOL-HC Apply 1 application topically 4 (four) times daily as needed for hemorrhoids.   multivitamin with minerals Tabs tablet Take 1 tablet by mouth daily.   polyethylene glycol packet Commonly known as:  MIRALAX /  GLYCOLAX Take 17 g by mouth daily as needed.   senna-docusate 8.6-50 MG tablet Commonly known as:  Senokot-S Take 1 tablet by mouth at bedtime as needed for mild constipation.   thiamine 100 MG tablet Take 1 tablet (100 mg total) by mouth daily.      Follow-up Information    Stark Klein, MD. Call.   Specialty:  General Surgery Why:  [facility] Ebbs information: [address] [place] [address] [phone]          No Known Allergies  Consultations:  Gastroenterology  Medical Oncology   Social Work   Procedures/Studies: Ct Chest Wo Contrast  Result Date: 02/05/2018 CLINICAL DATA:  New diagnosis of cholangiocarcinoma status post ERCP with plastic CBD stent placement. Chest staging. EXAM: CT CHEST WITHOUT CONTRAST TECHNIQUE: Multidetector CT imaging of the chest was performed following the standard protocol without IV contrast. COMPARISON:  01/31/2018 CT abdomen/pelvis. FINDINGS: Cardiovascular: Normal heart size. No significant pericardial fluid/thickening. Mildly atherosclerotic nonaneurysmal thoracic aorta. Normal caliber pulmonary arteries. Mediastinum/Nodes: No discrete thyroid nodules. There is a soft tissue density mass in the subcarinal region measuring 1.7 cm short axis (series 2/image 64), intimately associated with the wall of the midthoracic esophagus, presumably an enlarged paraesophageal node. No axillary adenopathy. Otherwise no pathologically enlarged mediastinal nodes. No discrete hilar adenopathy on these noncontrast images. Lungs/Pleura: No pneumothorax. No pleural effusion. No acute consolidative airspace disease, lung masses or significant pulmonary nodules. Small parenchymal bands in the right lower lobe, compatible with mild scarring or atelectasis. Upper abdomen: Redemonstration diffuse intrahepatic biliary ductal dilatation and abnormal soft tissue density in the central portal tracts in the visualized liver with stable small volume  perihepatic ascites. Expected pneumobilia in the left liver lobe status post reported CBD stent placement. CBD stent is visualized in the medial right upper quadrant on the scout topogram. Musculoskeletal: No aggressive appearing focal osseous lesions. Mild thoracic spondylosis. IMPRESSION: 1. Pathologically enlarged paraesophageal/subcarinal lymph node suspicious for mediastinal nodal metastasis. 2. No additional potential findings of metastatic disease in the chest. 3. Redemonstration of diffuse intrahepatic biliary ductal dilatation, abnormal soft tissue density in the central portal tracts and small volume perihepatic ascites, compatible with known cholangiocarcinoma. Aortic Atherosclerosis (ICD10-I70.0). Electronically Signed   By: Ilona Sorrel M.D.   On: 02/05/2018 15:33   US Abdomen Complete  Result Date: 01/31/2018 CLINICAL DATA:  Jaundiced, nauseous EXAM: ABDOMEN ULTRASOUND COMPLETE COMPARISON:  None. FINDINGS: Gallbladder: No cholelithiasis. Gallbladder sludge is present. Severe gallbladder wall thickening measuring up to 10 mm. Trace pericholecystic fluid. Negative sonographic Murphy sign. Common bile duct: Diameter: 10.2 mm Liver: No focal lesion identified. Within normal limits in parenchymal echogenicity. Moderate intrahepatic biliary ductal dilatation in the right and left hepatic lobes. Portal vein is patent on color Doppler imaging with normal direction of blood flow towards the liver. IVC: No abnormality visualized. Pancreas: Visualized portion unremarkable. Spleen: Size and appearance within normal limits. Right Kidney: Length: 11.2 cm. Echogenicity within normal limits.  No mass or hydronephrosis visualized. Left Kidney: Length: 11.2 cm. 1.2 x 1.6 x 1.1 cm anechoic left renal mass most consistent with a cyst. Echogenicity within normal limits. No solid mass or hydronephrosis visualized. Abdominal aorta: No aneurysm visualized. Other findings: None. IMPRESSION: 1. Dilated common bile duct with  intrahepatic biliary ductal dilatation of uncertain etiology. No obstructing mass or choledocholithiasis is identified. Recommend further evaluation with MR abdomen/MRCP. 2. Severe gallbladder wall thickening measuring up to 10 mm with a trace amount of pericholecystic fluid without cholelithiasis. This appearance can be seen in the setting of a calculus cholecystitis. Electronically Signed   By: Kathreen Devoid   On: 01/31/2018 09:33   Ct Abdomen Pelvis W Contrast  Result Date: 01/31/2018 CLINICAL DATA:  Abdominal pain and fatigue with biliary dilatation and gallbladder wall thickening ultrasound. Jaundice. EXAM: CT ABDOMEN AND PELVIS WITH CONTRAST TECHNIQUE: Multidetector CT imaging of the abdomen and pelvis was performed using the standard protocol following bolus administration of intravenous contrast. CONTRAST:  119mL ISOVUE-300 IOPAMIDOL (ISOVUE-300) INJECTION 61% COMPARISON:  Ultrasound 01/31/2018. FINDINGS: Lower chest: Mild dependent atelectasis at both lung bases. No significant pleural or pericardial effusion. Hepatobiliary: As demonstrated on ultrasound, there is diffuse intrahepatic biliary dilatation which is moderate to severe. The common hepatic duct measures up to 12 mm in diameter (coronal image 36/5). There is an abrupt cut off the common bile duct in the porta hepatis, also best seen on the reformatted images. No well-defined mass is seen in this area. The intrapancreatic portion of the common bile duct is normal in caliber. There is diffuse gallbladder wall thickening with wall calcification in the fundal region. There is ill-defined low-density in the liver surrounding the gallbladder. No well-defined gallbladder mass identified. The liver otherwise appears unremarkable. Pancreas: No evidence of pancreatic mass or pancreatic ductal dilatation. No surrounding inflammation. Spleen: Normal in size without focal abnormality. Adrenals/Urinary Tract: Both adrenal glands appear normal. There is a 12  mm cyst in the interpolar region of the left kidney. Anteriorly in the mid right kidney, there is a wedge-shaped area of decreased attenuation on the delayed images (image 19/7). This could reflect an area of inflammation or infarction. No evidence of urinary tract calculus or hydronephrosis. The bladder appears normal. Stomach/Bowel: No evidence of bowel wall thickening, distention or surrounding inflammatory change. The appendix appears normal. Vascular/Lymphatic: Approximately 15 mm celiac node on image 20/2. There are additional smaller nodes in the porta hepatis. No retroperitoneal or pelvic adenopathy. Mild aortic and branch vessel atherosclerosis. The portal, superior mesenteric and splenic veins are patent. Reproductive: Central dystrophic calcifications within the prostate gland. Other: Trace perihepatic ascites. No peritoneal nodularity. The anterior abdominal wall is intact. Musculoskeletal: No acute or significant osseous findings. IMPRESSION: 1. As demonstrated on earlier study, there is moderate to severe intrahepatic biliary dilatation with abrupt cut off of the common bile duct in the porta hepatis. There is irregular gallbladder wall thickening, wall calcification and surrounding low-density in the liver. These findings are highly concerning for malignancy, likely cholangiocarcinoma. Gallbladder carcinoma would be an additional consideration. At least 1 enlarged celiac node, suspicious for nodal metastasis. 2. ERCP for tissue sampling and biliary decompression recommended. 3. No evidence of distant metastasis. 4. Possible small infarct or area of inflammation anteriorly in the interpolar region of the right kidney. 5.  Aortic Atherosclerosis (ICD10-I70.0). Electronically Signed   By: Richardean Sale M.D.   On: 01/31/2018 12:35   Dg Ercp  Result Date: 02/02/2018 CLINICAL DATA:  Biliary dilatation.  Obstructive jaundice. EXAM: ERCP TECHNIQUE: Multiple spot images obtained with the fluoroscopic  device and submitted for interpretation post-procedure. FLUOROSCOPY TIME:  Fluoroscopy Time:  2 minutes and 28 seconds Number of Acquired Spot Images: 5 COMPARISON:  Abdominal CT 01/31/2018 FINDINGS: Cannulation and opacification of the common bile duct. Wire was advanced into the intrahepatic bile ducts. No significant dilatation of the common bile duct. There is marked dilatation of the intrahepatic ducts. Evidence for a stenosis in the region of the biliary bifurcation and common hepatic duct. A nonmetallic biliary stent was placed. Catheter extends into the intrahepatic biliary system. Partial filling of the cystic duct. IMPRESSION: Biliary obstruction/stenosis near the common hepatic duct and biliary bifurcation. Placement of biliary stent. These images were submitted for radiologic interpretation only. Please see the procedural report for the amount of contrast and the fluoroscopy time utilized. Electronically Signed   By: Markus Daft M.D.   On: 02/02/2018 15:42    ERCP Findings: The scout film was normal. The esophagus was successfully intubated  under direct vision. The scope was advanced to a normal major papilla in  the descending duodenum without detailed examination of the pharynx,  larynx and associated structures, and upper GI tract.The major papilla  appeared to have sepaate biliary and pancreatic duct openings. The upper  GI tract was grossly normal. 0.035 inch x 260 cm straight Hydra Jagwire  was passed into the biliary tree. The traction (standard) sphincterotome  was passed over the guidewire and the bile duct was then deeply  cannulated. Contrast was injected. I personally interpreted the bile  duct images. There was brisk flow of contrast through the ducts. Image  quality was excellent. Contrast extended to the hepatic ducts. The  gallbladder was not visualized. The left and right hepatic ducts and all  intrahepatic  branches were diffusely dilated. The common hepatic duct  and bifurcation contained a single severe stenosis about 20 mm in  length. Cells for cytology were obtained by brushing. One 7 Fr by 9 cm  plastic stent was placed 8 cm into the bile duct, with the proximal end  in the left intrahepatic duct. The stent was in good position. The total  fluoroscopy exposure time was 2 minutes and 33 seconds. 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 The  stricture was malignant appearing. - The left and right hepatic ducts and all  intrahepatic branches were dilated. - One plastic stent was placed into the common bile  duct.  Subjective: Seen and examined and was improved. No CP or SOB. Ready to go back to [place]. Discussed with patient about importance of follow up.   Discharge Exam: Vitals:   02/06/18 2155 02/07/18 0423  BP: 127/73 131/83  Pulse: (!) 58 66  Resp: 18 18  Temp: 99.1 F (37.3 C) 98.9 F (37.2 C)  SpO2: 99% 100%   Vitals:   02/06/18 1359 02/06/18 2155 02/07/18 0423 02/07/18 0500  BP: 123/66 127/73 131/83   Pulse: (!) 59 (!) 58 66   Resp: 16 18 18    Temp: 99.2 F (37.3 C) 99.1 F (37.3 C) 98.9 F (37.2 C)   TempSrc: Oral Oral Oral   SpO2: 100% 99% 100%   Weight:    66.8 kg (147 lb 4.8 oz)  Height:       General: Pt isa Jaundiced AAM who is alert, awake, not in acute distress Cardiovascular: RRR, S1/S2 +, no rubs, no gallops Respiratory: CTA bilaterally, no wheezing, no rhonchi Abdominal: Soft, Mildly Tender,  ND, bowel sounds + Extremities: no edema, no cyanosis;   The results of significant diagnostics from this hospitalization (including imaging, microbiology, ancillary and laboratory) are listed below for reference.    Microbiology: Recent Results (from the past 240 hour(s))   Culture, blood (routine x 2)     Status: None   Collection Time: 01/31/18  9:57 AM  Result Value Ref Range Status   Specimen Description   Final    BLOOD LEFT FOREARM Performed at [facility] [address] [place], [place] [address]    Special Requests   Final    BOTTLES DRAWN AEROBIC AND ANAEROBIC Blood Culture adequate volume Performed at [facility] [address], [place], [place] [address]    Culture   Final    NO GROWTH 5 DAYS Performed at [facility], [place] [address], [place], [place] [address]    Report Status 02/05/2018 FINAL  Final  Culture, blood (routine x 2)     Status: None   Collection Time: 01/31/18 10:06 AM  Result Value Ref Range Status   Specimen Description   Final    BLOOD LEFT HAND Performed at [facility] [address] [place], [place] [address]    Special Requests   Final    BOTTLES DRAWN AEROBIC AND ANAEROBIC Blood Culture adequate volume Performed at [facility] [address], [place], [place] [address]    Culture   Final    NO GROWTH 5 DAYS Performed at [facility], [place] [address] [place], [place] [address]    Report Status 02/05/2018 FINAL  Final    Labs: BNP (last 3 results) No results for input(s): BNP in the last 8760 hours. Basic Metabolic Panel: Recent Labs  Lab 02/02/18 0413 02/03/18 0429 02/04/18 0534 02/05/18 0455 02/06/18 0433 02/07/18 0420  NA 136 135 138 136 138 138  K 3.6 3.8 3.8 3.6 4.1 3.9  CL 102 101 104 102 102 103  CO2 25 26 27 26 28 28   GLUCOSE 95 137* 89 94 97 95  BUN 7 13 10 11 9 8   CREATININE 0.53* 0.75 0.75 0.73 0.68 0.70  CALCIUM 8.9 8.7* 8.6* 8.8* 9.0 8.8*  MG 1.9 2.0  --   --  2.2 2.1  PHOS  --   --   --   --  3.7 3.8   Liver Function Tests: Recent Labs  Lab 02/03/18 0429 02/04/18 0534 02/05/18 0455 02/06/18 0433 02/07/18 0420  AST 122* 130* 133* 150* 137*  ALT 123* 128* 129* 148* 141*  ALKPHOS  441* 436* 468* 533* 509*  BILITOT 15.8* 12.7* 12.2* 12.0* 10.7*  PROT 5.9* 5.7* 5.9* 6.6 6.0*  ALBUMIN 1.9* 1.9* 1.9* 2.3* 2.0*   No results for input(s): LIPASE, AMYLASE in the last 168 hours. No results for input(s): AMMONIA in the last 168 hours. CBC: Recent Labs  Lab 02/01/18 0453 02/03/18 0429 02/05/18 0455 02/06/18 0433 02/07/18 0420  WBC 9.4 15.8* 11.5* 11.4* 10.6*  NEUTROABS  --  12.4* 7.4 6.9 6.2  HGB 9.6* 9.6* 9.6* 10.0* 9.2*  HCT 28.5* 28.5* 29.1* 30.8* 28.3*  MCV 88.8 90.2 92.4 94.8 95.3  PLT 401* 402* 395 396 380   Cardiac Enzymes: No results for input(s): CKTOTAL, CKMB, CKMBINDEX, TROPONINI in the last 168 hours. BNP: Invalid input(s): POCBNP CBG: Recent Labs  Lab 02/06/18 0728 02/06/18 1155 02/06/18 1526 02/06/18 2153 02/07/18 0749  GLUCAP 93 126* 119* 113* 129*  D-Dimer No results for input(s): DDIMER in the last 72 hours. Hgb A1c No results for input(s): HGBA1C in the last 72 hours. Lipid Profile No results for input(s): CHOL, HDL, LDLCALC, TRIG, CHOLHDL, LDLDIRECT in the last 72 hours. Thyroid function studies No results for input(s): TSH, T4TOTAL, T3FREE, THYROIDAB in the last 72 hours.  Invalid input(s): FREET3 Anemia work up No results for input(s): VITAMINB12, FOLATE, FERRITIN, TIBC, IRON, RETICCTPCT in the last 72 hours. Urinalysis    Component Value Date/Time   COLORURINE AMBER (A) 01/31/2018 [place] 01/31/2018 1052   LABSPEC 1.012 01/31/2018 1052   PHURINE 6.0 01/31/2018 1052   GLUCOSEU NEGATIVE 01/31/2018 1052   HGBUR NEGATIVE 01/31/2018 1052   BILIRUBINUR MODERATE (A) 01/31/2018 1052   KETONESUR NEGATIVE 01/31/2018 1052   PROTEINUR NEGATIVE 01/31/2018 1052   NITRITE NEGATIVE 01/31/2018 1052   LEUKOCYTESUR NEGATIVE 01/31/2018 1052   Sepsis Labs Invalid input(s): PROCALCITONIN,  WBC,  LACTICIDVEN Microbiology Recent Results (from the past 240 hour(s))  Culture, blood (routine x 2)     Status: None    Collection Time: 01/31/18  9:57 AM  Result Value Ref Range Status   Specimen Description   Final    BLOOD LEFT FOREARM Performed at [facility], [place] [address], [place], [place] [address]    Special Requests   Final    BOTTLES DRAWN AEROBIC AND ANAEROBIC Blood Culture adequate volume Performed at [facility] [address] [place], [place] [address]    Culture   Final    NO GROWTH 5 DAYS Performed at [facility], [place] [address], [place], [place] [address]    Report Status 02/05/2018 FINAL  Final  Culture, blood (routine x 2)     Status: None   Collection Time: 01/31/18 10:06 AM  Result Value Ref Range Status   Specimen Description   Final    BLOOD LEFT HAND Performed at [facility] [address] [place], [place] [address]    Special Requests   Final    BOTTLES DRAWN AEROBIC AND ANAEROBIC Blood Culture adequate volume Performed at [facility] [address] [place], [place] [address]    Culture   Final    NO GROWTH 5 DAYS Performed at [facility], [facility] [address], [place], [place] [address]    Report Status 02/05/2018 FINAL  Final   Time coordinating discharge: 25 minutes  SIGNED:  Kerney Elbe, DO [facility] 02/07/2018, 9:46 AM Pager [phone]  If 7PM-7AM, please contact night-coverage [email or web address] Password [email or web address]

## 2018-02-07 NOTE — Progress Notes (Signed)
CSW recived call from [place] at [facility] in [place].   He reports they plan to assist the patient with shelter. He has instructed the patient to be in in line at 5:30am in the morning. Patient reports understanding. Patient states he plans to also reach out to his church community.   CSW provided the patient with directions to tire location.  No other CSW needs identified.   Kathrin Greathouse, Latanya Presser, MSW Clinical Social Worker  [phone] 02/07/2018  10:58 AM

## 2018-02-10 ENCOUNTER — Telehealth: Payer: Self-pay | Admitting: *Deleted

## 2018-02-10 NOTE — Telephone Encounter (Signed)
Spoke with Hoyle Sauer @ [facility] in [facility].  Informed her of Dr. Ernestina Penna new pt referral to [facility] for continue care at the center.   Faxed consult notes along with radiology reports to [facility].  Gave info to [place] in radiology to send disc to the center. Carolyn's     Phone    [phone]       ;      Fax        [phone].  [facility] [place], [place] Att:  Hoyle Sauer  3rd floor.

## 2018-02-13 ENCOUNTER — Inpatient Hospital Stay: Payer: Self-pay | Admitting: Hematology

## 2018-02-13 ENCOUNTER — Encounter: Payer: Self-pay | Admitting: Nutrition

## 2018-02-13 ENCOUNTER — Other Ambulatory Visit: Payer: Self-pay

## 2018-08-07 ENCOUNTER — Encounter
Admit: 2018-08-07 | Discharge: 2018-08-12 | Disposition: E | Payer: MEDICAID | Source: Other Acute Inpatient Hospital | Attending: Geriatric Medicine | Admitting: Geriatric Medicine

## 2018-08-07 DIAGNOSIS — I6930 Unspecified sequelae of cerebral infarction: Secondary | ICD-10-CM

## 2018-08-07 MED ORDER — ACETAMINOPHEN 325 MG TABLET
325 mg | ORAL | Status: DC | PRN
Start: 2018-08-07 — End: 2018-08-08

## 2018-08-07 MED ORDER — ACETAMINOPHEN 650 MG RECTAL SUPPOSITORY
650 mg | RECTAL | Status: DC | PRN
Start: 2018-08-07 — End: 2018-08-11
  Administered 2018-08-09: 19:00:00 via RECTAL

## 2018-08-07 MED ORDER — HALOPERIDOL LACTATE 5 MG/ML IJ SOLN
5 mg/mL | INTRAMUSCULAR | Status: DC | PRN
Start: 2018-08-07 — End: 2018-08-08
  Administered 2018-08-07 – 2018-08-08 (×5): via INTRAVENOUS

## 2018-08-07 MED ORDER — LORAZEPAM 2 MG/ML IJ SOLN
2 mg/mL | INTRAMUSCULAR | Status: DC | PRN
Start: 2018-08-07 — End: 2018-08-11

## 2018-08-07 MED ORDER — HEPARIN, PORCINE (PF) 100 UNIT/ML IV SYRINGE
100 unit/mL | Freq: Two times a day (BID) | INTRAVENOUS | Status: DC
Start: 2018-08-07 — End: 2018-08-11
  Administered 2018-08-07 – 2018-08-10 (×7)

## 2018-08-07 MED ORDER — MORPHINE 2 MG/ML INJECTION
2 mg/mL | INTRAMUSCULAR | Status: DC | PRN
Start: 2018-08-07 — End: 2018-08-08

## 2018-08-07 MED ORDER — SODIUM CHLORIDE 0.9 % IJ SYRG
INTRAMUSCULAR | Status: DC | PRN
Start: 2018-08-07 — End: 2018-08-11
  Administered 2018-08-08 – 2018-08-10 (×13)

## 2018-08-07 MED ORDER — HYOSCYAMINE 0.125 MG SUBLINGUAL TAB
0.125 mg | SUBLINGUAL | Status: DC | PRN
Start: 2018-08-07 — End: 2018-08-08

## 2018-08-07 MED ORDER — LORAZEPAM 2 MG/ML IJ SOLN
2 mg/mL | INTRAMUSCULAR | Status: DC | PRN
Start: 2018-08-07 — End: 2018-08-11
  Administered 2018-08-08 – 2018-08-09 (×4): via INTRAVENOUS

## 2018-08-07 MED ORDER — SODIUM CHLORIDE 0.9 % IJ SYRG
Freq: Two times a day (BID) | INTRAMUSCULAR | Status: DC
Start: 2018-08-07 — End: 2018-08-11
  Administered 2018-08-07 – 2018-08-10 (×7): via INTRAVENOUS

## 2018-08-07 MED ORDER — HEPARIN, PORCINE (PF) 100 UNIT/ML IV SYRINGE
100 unit/mL | INTRAVENOUS | Status: DC | PRN
Start: 2018-08-07 — End: 2018-08-11

## 2018-08-07 MED ORDER — MORPHINE CONCENTRATE 20 MG/ML ORAL SYRINGE  (FOR ORAL USE ONLY)
20 mg/mL | ORAL | Status: DC | PRN
Start: 2018-08-07 — End: 2018-08-08

## 2018-08-07 MED ORDER — GLYCOPYRROLATE 0.2 MG/ML IJ SOLN
0.2 mg/mL | INTRAMUSCULAR | Status: DC | PRN
Start: 2018-08-07 — End: 2018-08-08

## 2018-08-07 MED ORDER — HALOPERIDOL LACTATE 5 MG/ML IJ SOLN
5 mg/mL | INTRAMUSCULAR | Status: DC | PRN
Start: 2018-08-07 — End: 2018-08-08

## 2018-08-07 MED ORDER — MORPHINE 2 MG/ML INJECTION
2 mg/mL | INTRAMUSCULAR | Status: DC | PRN
Start: 2018-08-07 — End: 2018-08-08
  Administered 2018-08-07 – 2018-08-08 (×6): via INTRAVENOUS

## 2018-08-07 MED FILL — MORPHINE 2 MG/ML INJECTION: 2 mg/mL | INTRAMUSCULAR | Qty: 1

## 2018-08-07 MED FILL — MONOJECT PREFILL ADVANCED (PF) 100 UNIT/ML INTRAVENOUS SYRINGE: 100 unit/mL | INTRAVENOUS | Qty: 3

## 2018-08-07 MED FILL — MONOJECT PREFILL ADVANCED 0.9 % SODIUM CHLORIDE INJECTION SYRINGE: INTRAMUSCULAR | Qty: 10

## 2018-08-07 MED FILL — HALOPERIDOL LACTATE 5 MG/ML IJ SOLN: 5 mg/mL | INTRAMUSCULAR | Qty: 1

## 2018-08-07 NOTE — Progress Notes (Addendum)
Pt admitted to [facility] for GIP level of care with hospice dx of sequelae of CVA and symptoms of pain and agitation. Pt was to be premedicated with morphine prior to transport. Pt has eyes open, but does not track. Left side is flaccid. IV access present in left forearm. Skin is warm, dry and intact. RR 12 and unlabored with bil wheezing present. Pt does not appear to be in any distress at this time. No s/s of pain, agitation or dyspnea.   Foley cath inserted with return of clear amber colored urine. Pt did moan x 1 with insertion of cath, otherwise pt is non responsive. Family is not present.   Bed low and SR up with tab alert on.     1319 Port in right chest accessed with good blood return. Morphine 2 mg IV for pain and haldol 2 mg IV for agitation.     1433 Haldol 2 mg and morphine 2 mg IV for pain and agitation following bath.    1530 Pt resting with no s/s of distress at this time.     1815 Pt with no s/s of pain, agitation or dyspnea at this time.     Report given to oncoming RN

## 2018-08-07 NOTE — H&P (Signed)
History and Physical    Patient: Ethan Good MRN: [id number]  SSN: [id number]    Date of Birth: 1960/12/23  Age: 57 y.o.  Sex: male      Subjective:      Ethan Good is a 57 y.o. 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  He has gradually become unresponsive and the trajectory of his illness has continued in a downward fashion.Marland Kitchen  His family agrees with the providers that further aggressive diagnostic or therapeutic interventions would be of no benefit.  He is a DNR.  Family desires comfort measures.  He is requiring repeated parenteral doses of opioids and sedatives for his comfort.    No past medical history on file.  No past surgical history on file.   No family history on file.  Social History     Tobacco Use   ??? Smoking status: Not on file   Substance Use Topics   ??? Alcohol use: Not on file      Prior to Admission medications    Not on File        No Known Allergies    Review of Systems:  The remainder of the admission historical database is as outlined in the Clinical Record.    Objective:     Vitals:    07/23/2018 1110   BP: 158/90   Pulse: (!) 104   Resp: 12   Temp: 96.7 ??F (35.9 ??C)        Physical Exam:    VITAL signs are stable as outlined  .  SKIN examination reveals no trauma or petechiae.    HEENT examination reveals no adenopathy or scleral icterus and there is no neck vein distention.    THORACIC examination reveals a few scattered rhonchi but no wheezes or respiratory distress    CARDIAC examination reveals a regular rhythm with no diastolic murmurs or rubs.    ABDOMINAL examination reveals no clear-cut masses or tenderness.  Active bowel sounds.    EXTREMITIES reveal mild osteoarthritic changes with no acutely inflamed unstable joints.     VASCULAR examination reveals no edema or calf tenderness diminished peripheral pulses without peripheral ischemic changes.    NEUROLOGIC examination reveals  The patient to briefly open his eyes to voice command but no evidence of comprehension or response to simple commands.  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 sodium chloride (NS) flush 10 mL  10 mL InterCATHeter PRN   ??? heparin (porcine) pf 300 Units  300 Units InterCATHeter PRN   ??? morphine (ROXANOL) concentrated  oral syringe 10 mg  10 mg Oral Q30MIN PRN    Or   ??? morphine (ROXANOL) concentrated oral syringe 10 mg  10 mg SubLINGual Q30MIN PRN   ??? morphine injection 2 mg  2 mg SubCUTAneous Q20MIN PRN    Or   ??? morphine injection 2 mg  2 mg IntraVENous Q20MIN PRN   ??? acetaminophen (TYLENOL) suppository 650 mg  650 mg Rectal Q3H PRN   ??? acetaminophen (TYLENOL) tablet 650 mg  650 mg Oral Q4H PRN   ??? haloperidol lactate (HALDOL) injection 2 mg  2 mg SubCUTAneous Q1H PRN    Or   ??? haloperidol lactate (HALDOL) injection 2 mg  2 mg IntraVENous Q1H PRN   ??? hyoscyamine SL (LEVSIN/SL) tablet 0.125 mg  0.125 mg SubLINGual Q4H PRN   ??? glycopyrrolate (ROBINUL) injection 0.2 mg  0.2 mg SubCUTAneous Q4H PRN   ??? LORazepam (ATIVAN) injection 2 mg  2 mg IntraVENous Q10MIN PRN   ??? sodium chloride (NS) flush 10 mL  10 mL IntraVENous Q12H    ??? heparin (porcine) pf 300 Units  300 Units InterCATHeter Q12H       I have reviewed the situation with the staff nurse.  There is no family in attendance.  I agree that the trajectory towards staff will likely occur sometime this week.  He appears to be comfortable present time.  I doubt that he will arouse enough to sit desires something to eat but we will monitor this.      Signed By: Gurney MaxinSteven J Raela Bohl, MD     August 07, 2018

## 2018-08-07 NOTE — Other (Signed)
Patient: Ethan Good    Date: 08/11/2018  Time: 12:39 PM    [facility] Case Manager Notes  LMSW has reviewed the Rn initial assessment and agrees with the POC.        Signed by: Marliss Czar

## 2018-08-07 NOTE — Other (Signed)
Patient: Ethan Good    Date: 08/12/2018  Time: 4:32 PM    [facility] Volunteer Notes  VC has reviewed the initial RN Comprehensive assessment and the plan of care.  VC is in agreement with the plan of care. Pt will receive general support by volunteers as evidenced by comfort bag delivery, snack cart, supplies to the room, companionship visits, pet therapy and/or therapeutic music.             Signed by: Steffanie Dunn

## 2018-08-07 NOTE — Progress Notes (Addendum)
1900- [place]. Patient resting without any s,sx of pain or distress. Bed is low and locked, tab alert in place, door left open for continuous monitoring.    2030- patient is awake and moving about in bed. Medicated with prn morphine for pain and haldol for agitaiton.    2100- flacc 0, patient appears to be sleeping.    2230- patient resting quietly. No s/sx to manage.    2355- port dressing not intact. Dressing changed. Patient tolerated well.    0112- prn morphine given for pain and haldol for agitation. Turned and repositioned.    0125- flacc 0. No more s.sx of pain or agitation.    0335- prn morphine given for pain and haldol given for agitation.    0400- flacc 0, resting quietly    0411- allevyn placed to sacrum for prevention of skin breakdown and p[atient floated    0415- ativan given for restlessness.    [id number]- patient resting with no s/sx of pain or agitation

## 2018-08-07 NOTE — Other (Signed)
Patient: Ethan Good    Date: 08/31/2018  Time: 12:21 PM    [facility] Nurse Notes  Pt admitted to [facility] for GIP level of care with hospice dx of sequelae of CVA and symptoms of pain and agitation. Pt was to be premedicated with morphine prior to transport. Pt has eyes open, but does not track. Left side is flaccid. IV access present in left forearm. Skin is warm, dry and intact. RR 12 and unlabored with bil wheezing present. Pt does not appear to be in any distress at this time. No s/s of pain, agitation or dyspnea.   Foley cath inserted with return of clear amber colored urine. Pt did moan x 1 with insertion of cath, otherwise pt is non responsive. Family is not present.   Bed low and SR up with tab alert on.   Admission complete and initial general hospice care plan initiated which includes spiritual, psychosocial and bereavement. Immediate needs recognized for symptom management. IDG team made aware of plan of care and immediate needs.         Signed by: Patsy Springbrook, RN

## 2018-08-08 DIAGNOSIS — I6930 Unspecified sequelae of cerebral infarction: Secondary | ICD-10-CM

## 2018-08-08 MED ORDER — BISACODYL 10 MG RECTAL SUPPOSITORY
10 mg | RECTAL | Status: DC | PRN
Start: 2018-08-08 — End: 2018-08-11

## 2018-08-08 MED ORDER — GLYCOPYRROLATE 0.2 MG/ML IJ SOLN
0.2 mg/mL | INTRAMUSCULAR | Status: DC | PRN
Start: 2018-08-08 — End: 2018-08-11

## 2018-08-08 MED ORDER — HALOPERIDOL LACTATE 5 MG/ML IJ SOLN
5 mg/mL | INTRAMUSCULAR | Status: DC | PRN
Start: 2018-08-08 — End: 2018-08-08

## 2018-08-08 MED ORDER — LORAZEPAM 2 MG/ML IJ SOLN
2 mg/mL | Freq: Three times a day (TID) | INTRAMUSCULAR | Status: DC
Start: 2018-08-08 — End: 2018-08-11
  Administered 2018-08-08 – 2018-08-10 (×7): via INTRAVENOUS

## 2018-08-08 MED ORDER — PHENOBARBITAL SODIUM 65 MG/ML INJECTION
65 mg/mL | Freq: Two times a day (BID) | INTRAMUSCULAR | Status: DC
Start: 2018-08-08 — End: 2018-08-11
  Administered 2018-08-08 – 2018-08-10 (×5): via SUBCUTANEOUS

## 2018-08-08 MED ORDER — FENTANYL 25 MCG/HR 72 HR TRANSDERM PATCH
25 mcg/hr | TRANSDERMAL | Status: DC
Start: 2018-08-08 — End: 2018-08-11

## 2018-08-08 MED ORDER — HYDROMORPHONE 0.5 MG/0.5 ML SYRINGE
0.5 mg/ mL | INTRAMUSCULAR | Status: DC | PRN
Start: 2018-08-08 — End: 2018-08-11

## 2018-08-08 MED ORDER — HYDROMORPHONE 0.5 MG/0.5 ML SYRINGE
0.5 mg/ mL | INTRAMUSCULAR | Status: DC | PRN
Start: 2018-08-08 — End: 2018-08-11
  Administered 2018-08-09 – 2018-08-10 (×6): via INTRAVENOUS

## 2018-08-08 MED FILL — LORAZEPAM 2 MG/ML IJ SOLN: 2 mg/mL | INTRAMUSCULAR | Qty: 1

## 2018-08-08 MED FILL — MORPHINE 2 MG/ML INJECTION: 2 mg/mL | INTRAMUSCULAR | Qty: 1

## 2018-08-08 MED FILL — HALOPERIDOL LACTATE 5 MG/ML IJ SOLN: 5 mg/mL | INTRAMUSCULAR | Qty: 1

## 2018-08-08 MED FILL — PHENOBARBITAL SODIUM 65 MG/ML INJECTION: 65 mg/mL | INTRAMUSCULAR | Qty: 1

## 2018-08-08 NOTE — Progress Notes (Signed)
Patient appears to be moving toward an active state of dying.  He doesn't not appear to be uncomfortable. No moaning or grimacing.  No family in the room at this time.       Chaplain provided support with ministry of presence, ministry of music, scripture and prayer.       Chaplain reached over and put her hand on patient's arm while quoting Psalms 23. Chaplain sang Romero Liner,  And offered a prayer.

## 2018-08-08 NOTE — Progress Notes (Addendum)
Report received. Pt round. Pt identified by name. In bed with eyes closed. No s/s of pain, agitation or dyspnea. Bed low and SR up with tab alerts on.    0843 Ativan 1 mg IV for agitation and Morphine 2 mg IV for pain    1145 Scheduled phenobarb given.     1248 Fentanyl 25 mcg/hr TD patch placed on left chest.     1417 Scheduled ativan given. Bathed and repositioned.     1600 Resting with no s/s of distress at this time.     1830 Report given to oncoming RN

## 2018-08-08 NOTE — Progress Notes (Addendum)
Walking rounds completed. Pt identified by name/DOB. Eyes closed. Respirations non labored. Fentanyl patch observed intact to left chest. Facial features flat,relaxed. Flacc 0/10. Pt is GIP level of care with Dx of  Sequelae of CVA. Flacc 0/10. Bed in low and locked position with side rails up x 2 and call light in reach.     2120 Scheduled medications given at this time via port. Site WNL. Respirations non labored. Pt will have occasional sporadic hiccoughs at time but do not seem troubling to pt. Flacc remains 0/10. Bed in low and locked position with side rails x 2  With call light in reach. Door left open as pt is unaccompanied.     0000 Resting peacefully with eyes closed. Respirations non labored yet irregular at times. No distress,discomfort,anxiety or nausea noted. No seizure activity. Flacc 0/10. All care continued.     0305 Ativan 1 mg given slow IV for restlessness and Dilaudid 0.5 mg IV for dyspnea.     0400 Resting peacefully. Respirations non labored. Flacc 0/10. All care continued.     [id number] Scheduled Ativan 1 mg given IV at this time. Pt is resting quietly. No distress observed. All care continued.

## 2018-08-08 NOTE — Progress Notes (Signed)
LMSW went to complete the SW assessment. Pt is unaccompanied at this time and unable to participate in assessment. LMSW will try again later and/or call family

## 2018-08-08 NOTE — Other (Cosign Needed)
Hospice [facility] Collabor[facility]  Date: 08/08/18  Time: 12:54 PM    ___________________    Patient: Ethan Good  Coverage Information:     Payor: MEDICAID OF [place]     Plan: [facility] MEDICAID OF [place]     Subscriber ID: [id number]     Phone Number:   MRN: [id number]  Current Benefit Period: Benefit Period 1  Start Date: 2018-09-03  End Date: 11/04/2018      Medical Director: Francia Greaves MD   Hospice Attending Provider: No Hospice attending provider.    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    Current Medications:    Current Facility-Administered Medications:   ???  glycopyrrolate (ROBINUL) injection 0.2 mg, 0.2 mg, IntraVENous, Q4H PRN, Evalee Jefferson, NP  ???  bisacodyl (DULCOLAX) suppository 10 mg, 10 mg, Rectal, PRN, Evalee Jefferson, NP  ???  HYDROmorphone (DILAUDID) syringe 0.5 mg, 0.5 mg, SubCUTAneous, Q30MIN PRN **OR** HYDROmorphone (DILAUDID) syringe 0.5 mg, 0.5 mg, IntraVENous, Q30MIN PRN, Evalee Jefferson, NP  ???  LORazepam (ATIVAN) injection 1 mg, 1 mg, IntraVENous, Q8H, Marzolf, Garry Heater, NP  ???  PHENobarbital (LUMINAL) injection 65 mg, 65 mg, SubCUTAneous, Q12H, Evalee Jefferson, NP, 65 mg at 08/08/18 1145  ???  fentaNYL (DURAGESIC) 25 mcg/hr patch 1 Patch, 1 Patch, TransDERmal, Q72H, Evalee Jefferson, NP, 1 Patch at 08/08/18 1248  ???  sodium chloride (NS) flush 10 mL, 10 mL, InterCATHeter, PRN, Gurney Maxin, MD, 10 mL at 08/08/18 0418  ???  heparin (porcine) pf 300 Units, 300 Units, InterCATHeter, PRN, Gurney Maxin, MD, Stopped at 08/08/18 [phone]  ???  acetaminophen (TYLENOL) suppository 650 mg, 650 mg, Rectal, Q3H PRN, Gurney Maxin, MD  ???  LORazepam (ATIVAN) injection 2 mg, 2 mg, IntraVENous, Q10MIN PRN, Gurney Maxin, MD   ???  sodium chloride (NS) flush 10 mL, 10 mL, IntraVENous, Q12H, Gurney Maxin, MD, 10 mL at 08/08/18 0843  ???  heparin (porcine) pf 300 Units, 300 Units, InterCATHeter, Q12H, Gold, Francoise Schaumann, MD, 300 Units at 08/08/18 0843  ???  LORazepam (ATIVAN) injection 1 mg, 1 mg, IntraVENous, Q2H PRN, Gurney Maxin, MD, 1 mg at 08/08/18 8469    Orders:  Orders Placed This Encounter   ??? IP CONSULT TO SPIRITUAL CARE     Standing Status:   Standing     Number of Occurrences:   1     Order Specific Question:   Reason for Consult:     Answer:   Once on week one, then PRN. For [facility] Patients Only. For contracted patients, their primary hospice will continue to manage spiritual care needs.   ??? DIET NPO     Standing Status:   Standing     Number of Occurrences:   1   ??? VITAL SIGNS     Standing Status:   Standing     Number of Occurrences:   1   ??? NURSING-MISCELLANEOUS: Comfort Care Measures CONTINUOUS     Standing Status:   Standing     Number of Occurrences:   1     Order Specific Question:   Description of Order:     Answer:   Comfort Care Measures   ??? FOLEY CATHETER, CARE  1. Foley Catheter care every shift and PRN  2. Notify Physician of Foley Catheter leakage, occlusion, gross adherent sediment or accidental removal  3. Change Foley 30 days after insertion.     Standing Status:   Standing     Number of Occurrences:   1   ??? BLADDER CHECKS     May scan bladder PRN for urinary retention and or patient discomfort     Standing Status:   Standing     Number of Occurrences:   1   ??? PAIN ASSESSMENT Pain and Symptoms: Assess ever 4 hours and PRN, for GIP level of care. PRN Routine     Standing Status:   Standing     Number of Occurrences:   1     Order Specific Question:   Please describe the test or procedure you would like to order.     Answer:   Pain and Symptoms: Assess ever 4 hours and PRN, for GIP level of care.   ??? BEDREST, COMPLETE     Standing Status:   Standing     Number of Occurrences:   1    ??? NURSING-MISCELLANEOUS: Omit Bowel Regime Omit Bowel Regime: OMIT BOWEL REGIME: NPO  CONTINUOUS     Omit Bowel Regime: OMIT BOWEL REGIME: NPO     Standing Status:   Standing     Number of Occurrences:   1     Order Specific Question:   Description of Order:     Answer:   Omit Bowel Regime   ??? NURSING-MISCELLANEOUS: may access port Access port. Dressing change q 5 days with needle and dressing change q 10 days.  CONTINUOUS     Access port. Dressing change q 5 days with needle and dressing change q 10 days.     Standing Status:   Standing     Number of Occurrences:   1     Order Specific Question:   Description of Order:     Answer:   may access port   ??? NURSING-MISCELLANEOUS: admit 9/26: [facility]) Admitted GIP with sequelae of stroke for management of pain, dyspnea and agitation. Hospice Dx:   Sequelae of stroke. Associated Dx:   Metastatic cholangiocarcinoma, hypercoagulopathy, liver metas...     9/26: [facility]) Admitted GIP with sequelae of stroke for management of pain, dyspnea and agitation.  Hospice Dx:   Sequelae of stroke.   Associated Dx:   Metastatic cholangiocarcinoma, hypercoagulopathy, liver metastasis, anemia, history of previous strokes, long-term use anticoagulation   therapy, dysphagia, and expressive dysphasia.  Non Associated Dx: ASHD heart disease.     Standing Status:   Standing     Number of Occurrences:   1     Order Specific Question:   Description of Order:     Answer:   admit   ??? VITAL SIGNS     Standing Status:   Standing     Number of Occurrences:   1   ??? NURSING-MISCELLANEOUS: Comfort Care Measures CONTINUOUS     Standing Status:   Standing     Number of Occurrences:   1     Order Specific Question:   Description of Order:     Answer:   Comfort Care Measures   ??? NURSING ASSESSMENT:  SPECIFY Assess for GIP, routine, or respite level of care. Q SHIFT Routine     Standing Status:   Standing     Number of Occurrences:   1  Order Specific Question:   Please describe the test or procedure you would like to order.     Answer:   Assess for GIP, routine, or respite level of care.   ??? DRESSING CHANGE - PICC, MLC, SUBCUTANEOUS AND ACCESSED PORT DRESSING CHANGE     PICC, MLC, SUBCUTANEOUS AND ACCESSED PORT DRESSING CHANGE:  Change catheter site dressing every 5 days and prn if site dressing becomes damp loosened or visibly soiled       Standing Status:   Standing     Number of Occurrences:   1   ??? DO NOT RESUSCITATE     Standing Status:   Standing     Number of Occurrences:   1     Order Specific Question:   Comfort Measures Only?     Answer:   Yes   ??? OXYGEN CANNULA Liters per minute: 2; Indications for O2 therapy: RESPIRATORY DISTRESS PRN Routine     Standing Status:   Standing     Number of Occurrences:   1     Order Specific Question:   Liters per minute:     Answer:   2     Order Specific Question:   Indications for O2 therapy     Answer:   RESPIRATORY DISTRESS   ??? sodium chloride (NS) flush 10 mL   ??? heparin (porcine) pf 300 Units   ??? DISCONTD: morphine (ROXANOL) concentrated oral syringe 10 mg   ??? DISCONTD: morphine (ROXANOL) concentrated oral syringe 10 mg   ??? DISCONTD: morphine injection 2 mg   ??? DISCONTD: morphine injection 2 mg   ??? acetaminophen (TYLENOL) suppository 650 mg   ??? DISCONTD: acetaminophen (TYLENOL) tablet 650 mg   ??? DISCONTD: haloperidol lactate (HALDOL) injection 2 mg   ??? DISCONTD: haloperidol lactate (HALDOL) injection 2 mg   ??? DISCONTD: hyoscyamine SL (LEVSIN/SL) tablet 0.125 mg   ??? DISCONTD: glycopyrrolate (ROBINUL) injection 0.2 mg   ??? LORazepam (ATIVAN) injection 2 mg   ??? sodium chloride (NS) flush 10 mL   ??? heparin (porcine) pf 300 Units   ??? LORazepam (ATIVAN) injection 1 mg   ??? DISCONTD: haloperidol lactate (HALDOL) injection 2 mg   ??? DISCONTD: haloperidol lactate (HALDOL) injection 2 mg   ??? glycopyrrolate (ROBINUL) injection 0.2 mg   ??? bisacodyl (DULCOLAX) suppository 10 mg   ??? OR Linked Order Group     ??? HYDROmorphone (DILAUDID) syringe 0.5 mg    ??? HYDROmorphone (DILAUDID) syringe 0.5 mg   ??? LORazepam (ATIVAN) injection 1 mg   ??? PHENobarbital (LUMINAL) injection 65 mg   ??? fentaNYL (DURAGESIC) 25 mcg/hr patch 1 Patch   ??? INITIAL PHYSICIAN ORDER: HOSPICE Level Of Care: General Inpatient; Reason for Admission: hemorrhagic stroke or respiratory neurologic and associated  cholangiocarcinoma care     Standing Status:   Standing     Number of Occurrences:   1     Order Specific Question:   Status     Answer:   Hospice     Order Specific Question:   Level Of Care     Answer:   General Inpatient     Order Specific Question:   Reason for Admission     Answer:   hemorrhagic stroke or respiratory neurologic and associated  cholangiocarcinoma care     Order Specific Question:   Inpatient Hospitalization Certified Necessary for the Following Reasons     Answer:   3. Patient receiving treatment that can only be provided in an  inpatient setting (further clarification in H&P documentation)     Order Specific Question:   Admitting Diagnosis     Answer:   Hemorrhagic stroke [facility]) [[id number]]     Order Specific Question:   Terminal Prognosis Diagnosis(es)     Answer:   Hemorrhagic stroke [facility]) [[id number]]     Order Specific Question:   Admitting Physician     Answer:   Lavada Mesi     Order Specific Question:   Attending Physician     Answer:   Lavada Mesi   ??? IP CONSULT TO SOCIAL WORK     Standing Status:   Standing     Number of Occurrences:   1     Order Specific Question:   Reason for Consult:     Answer:   For [facility] Patients Only. For contracted patients, their primary hospice will continue to manage social work needs.       Allergies:  No Known Allergies    Care Plan:  Multidisciplinary Problems (Active)     Problem: Anticipatory Grief     Dates: Start: 08/16/2018       Description:     Disciplines: Interdisciplinary    Goal: Explore reactions to and verbalize acceptance of impending loss      Dates: Start: August 16, 2018       Description: Patient/family/caregiver will explore reactions to and verbalize acceptance of impending loss.    Disciplines: Interdisciplinary    Intervention: Assess grief responses     Dates: Start: 08-16-2018       Description:           Intervention: Assist with grief counseling     Dates: Start: 16-Aug-2018       Description: Chaplain to assist.          Intervention: Instruct on stages of grief     Dates: Start: 08/16/2018       Description: Instruct patient/caregiver on stages of grief.          Intervention: Offer grief support group     Dates: Start: Aug 16, 2018       Description: Patient/family/caregiver will explore reactions to and verbalize acceptance of impending loss.                      Problem: Anxiety/Agitation     Dates: Start: 2018/08/16       Description:     Disciplines: Interdisciplinary    Goal: Verbalize and demonstrate ability to manage anxiety     Dates: Start: 08/16/18   Expected End: 08/14/18       Description: The patient/family/caregiver will verbalize and demonstrate ability to manage the patient's anxiety throughout hospice care.  Medication:  Haldol 2 mg SQ/IV q 1 hour PRN anxiety   Ativan 1 mg IV/IM q 2 hours PRN anxiety     Disciplines: Interdisciplinary    Intervention: Assess for anxiety/agitation     Dates: Start: 08/16/18       Description: Assess for signs and symptoms of anxiety and agitation.          Intervention: Instruction strategies to reduce anxiety/agitation     Dates: Start: August 16, 2018       Description: Instruct patient/caregiver on strategies to reduce anxiety/agitation.                      Problem: Coping and Emotional Distress     Dates: Start: 08-16-2018  Description:     Disciplines: Interdisciplinary    Goal: Demonstrate acceptance of terminal illness and understanding of disease progression     Dates: Start: 08-19-2018       Description: Patient/family/caregiver will demonstrate acceptance of  terminal disease and understanding of disease progression while employing appropriate coping mechanisms.    Disciplines: Interdisciplinary    Intervention: Assess for alteration in coping     Dates: Start: 2018/08/19       Description:           Intervention: Assess for signs/symptoms of emotional distress     Dates: Start: 2018-08-19       Description:           Intervention: Instruct on effective coping skills     Dates: Start: 2018-08-19       Description: Instruct patient/caregiver on effective coping skills.          Intervention: Instruct on strategies to reduce emotional distress     Dates: Start: 08-19-18       Description: Instruct patient/caregiver on strategies to reduce emotional distress.                      Problem: End of Life Process     Dates: Start: Aug 19, 2018       Description:     Disciplines: Interdisciplinary    Goal: Demonstrate understanding of end of life processes     Dates: Start: 08-19-18       Description: Patient/caregiver will understand end of life processes.    Disciplines: Interdisciplinary    Intervention: Assess for signs/symptoms of terminal restlessness     Dates: Start: 08-19-2018       Description:           Intervention: Instruct on strategies to reduce terminal restlessness     Dates: Start: 19-Aug-2018       Description:           Intervention: Instruct on the dying process     Dates: Start: 08/19/2018       Description:           Intervention: Instruct: imminent death     Dates: Start: August 19, 2018       Description:           Intervention: Instruct: process at end of life     Dates: Start: 08-19-18       Description:                       Problem: Falls - Risk of     Dates: Start: 08/19/2018       Description:     Disciplines: Interdisciplinary    Goal: *Absence of Falls     Dates: Start: August 19, 2018   Expected End: 08/14/18       Description: Document Bridgette Habermann Fall Risk and appropriate interventions in the flowsheet.    Disciplines: Interdisciplinary                 Problem: Hospice Orientation     Dates: Start: 19-Aug-2018       Description:     Disciplines: Interdisciplinary    Goal: Demonstrate understanding of hospice philosophy, plan of care, and home hospice program     Dates: Start: 2018/08/19   Expected End: 08/15/18       Description: The patient/family/caregiver will demonstrate understanding of hospice philosophy, plan of care and the home hospice program as evidenced by participation in meeting the  patient's psychosocial, spiritual, medical, and physical needs inclusive of medical supplies/equipment focusing on symptoms.    Disciplines: Interdisciplinary    Intervention: Instruct on hospice philosophy     Dates: Start: 2018/08/26       Description:           Intervention: Instruct: hospice orientation     Dates: Start: 26-Aug-2018       Description:           Intervention: Instruct: medical equipment     Dates: Start: 08-26-2018       Description: Instruct patient/caregiver on medical equipment and supplies.          Intervention: Instruct: medical power of attorney and will     Dates: Start: August 26, 2018       Description:           Intervention: Instruct:terminal illness     Dates: Start: 2018/08/26       Description:                       Problem: Infection - Risk of, Central Venous Catheter-Associated Bloodstream Infection     Dates: Start: 08/08/18       Description: Pt will remain free of infection during stay at [facility] lack of s/s of infection     Disciplines: Nurse, Interdisciplinary, RT    Goal: *Absence of infection signs and symptoms     Dates: Start: 08/08/18   Expected End: 08/14/18       Description:     Disciplines: Nurse, Interdisciplinary, RT    Intervention: Central venous catheter site(s) assessment (eg: Pain; color; exudate; edema; odor; skin integrity)     Dates: Start: 08/08/18       Description:           Intervention: Infection assessment -general (eg: Onset of weakness; white blood cells; shivering; hyper/hypoglycemia; temp/weight/energy/mental  status altered; tachycardia; hypotension; tachypnea; respiratory)     Dates: Start: 08/08/18       Description:           Intervention: Central venous catheter management per bundle (eg: Lines maintained; port sterility maintained; dressing changed per policy and procedure)     Dates: Start: 08/08/18       Description:                       Problem: Infection - Risk of, Urinary Catheter-Associated Urinary Tract Infection     Dates: Start: 08-26-18       Description:     Disciplines: Interdisciplinary    Goal: *Absence of infection signs and symptoms     Dates: Start: 2018-08-26   Expected End: 08/15/18       Description: Pt will remain free of infection during stay at [facility]     Disciplines: Interdisciplinary    Intervention: Urinary catheter needs assessment (eg: Impaired neurologic status; post void residual; spinal cord injury; urinary outflow obstruction; urinary retention; urinary incontinence; fluid imbalance)     Dates: Start: 08/26/18       Description:           Intervention: Urine assessment (eg: Clarity; color; odor; volume)     Dates: Start: 08/26/2018       Description:           Intervention: Infection signs and symptoms monitoring - urinary tract (eg: Flank or suprapubic pain; burning; fever; dysuria; frequency; urgency; cloudy/bloody/foul odor urine; confusion/agitation; incontinence)     Dates: Start: 08/26/2018  Description:           Intervention: Urinary catheter management (eg: Closed urinary catheter system maintenance; urinary catheter patency with free downhill flow; perineal care; monitor intake and output)     Dates: Start: August 27, 2018       Description:                       Problem: Pain     Dates: Start: 08/27/2018       Description:     Disciplines: Interdisciplinary    Goal: Verbalize satisfaction of level of comfort and symptom control     Dates: Start: 08/27/18   Expected End: 08/15/18       Description: Pt will remain free of pain during stay at [facility] FLACC score of 2 or less   Medications:  Morphine 2 mg SQ/IV q 20 min PRN pain  Roxanol 10 mg PO/SL q 30 min PRN pain    Fentanyl 25 mcg TD patch q 72 hours     Disciplines: Interdisciplinary    Intervention: Assess effectiveness of pain management     Dates: Start: 2018/08/27       Description:           Intervention: Assess for signs/symptoms of acute pain     Dates: Start: 08/27/18       Description:           Intervention: Assess for signs/symptoms of chronic pain     Dates: Start: August 27, 2018       Description:           Intervention: Instruct on non-pharmacological pain management     Dates: Start: 08/27/2018       Description:           Intervention: Instruct on pain scales     Dates: Start: 08/27/18       Description:                       Problem: Patient Education: Go to Patient Education Activity     Dates: Start: 2018/08/27       Description:     Disciplines: Interdisciplinary    Goal: Patient/Family Education     Dates: Start: 08/27/2018   Expected End: 08/14/18       Description:     Disciplines: Interdisciplinary                Problem: Patient Education: Go to Patient Education Activity     Dates: Start: 2018/08/27       Description:     Disciplines: Interdisciplinary    Goal: Patient/Family Education     Dates: Start: 27-Aug-2018   Expected End: 08/14/18       Description:     Disciplines: Interdisciplinary                Problem: Pressure Injury - Risk of     Dates: Start: 08-27-18       Description:     Disciplines: Interdisciplinary    Goal: *Prevention of pressure injury     Dates: Start: 08/27/18   Expected End: 08/14/18       Description: Document Braden Scale and appropriate interventions in the flowsheet.    Disciplines: Interdisciplinary                Problem: Spiritual Distress     Dates: Start: August 27, 2018       Description:     Disciplines: Interdisciplinary  Goal: Distress heard, acknowledged, and addressed     Dates: Start: 07/16/2018       Description: Patient/family/caregiver distress will be heard,  acknowledged, and addressed throughout hospice care.    Disciplines: Interdisciplinary    Intervention: Assess for signs/symptoms of spiritual distress     Dates: Start: 08/05/2018       Description:           Intervention: Consult on spiritual care     Dates: Start: 07/13/2018       Description: Consult to Spiritual Care          Intervention: Discuss strategies to reduce spiritual distress     Dates: Start: 07/31/2018       Description: Discuss with patient/caregiver strategies to reduce spiritual distress.                        Care Plan Problems/Goals      Progressing Towards Goal (6)     *Absence of Falls     Problem: Falls - Risk of    Disciplines: Interdisciplinary    Expected end:  08/14/18     Progressing Towards Goal By Patsy [place], RN on 08/08/18 1236               *Prevention of pressure injury     Problem: Pressure Injury - Risk of    Disciplines: Interdisciplinary    Expected end:  08/14/18     Progressing Towards Goal By Patsy Wilcox, RN on 08/08/18 1236               Verbalize satisfaction of level of comfort and symptom control     Problem: Pain    Disciplines: Interdisciplinary    Expected end:  08/15/18     Progressing Towards Goal By Patsy Maxwell, RN on 08/08/18 1236               Verbalize and demonstrate ability to manage anxiety     Problem: Anxiety/Agitation    Disciplines: Interdisciplinary    Expected end:  08/14/18     Progressing Towards Goal By Patsy [place], RN on 08/08/18 1236               *Absence of infection signs and symptoms     Problem: Infection - Risk of, Urinary Catheter-Associated Urinary Tract Infection    Disciplines: Interdisciplinary    Expected end:  08/15/18     Progressing Towards Goal By Patsy Clarks, RN on 08/08/18 1236               *Absence of infection signs and symptoms     Problem: Infection - Risk of, Central Venous Catheter-Associated Bloodstream Infection    Disciplines: Nurse, Interdisciplinary, RT    Expected end:  08/14/18      Progressing Towards Goal By Patsy [place], RN on 08/08/18 1236                      No Outcome (7)     Patient/Family Education     Problem: Patient Education: Go to Patient Education Activity    Disciplines:     Expected end:  08/14/18                 Patient/Family Education     Problem: Patient Education: Go to Patient Education Activity    Disciplines:     Expected end:  08/14/18  Demonstrate understanding of hospice philosophy, plan of care, and home hospice program     Problem: Hospice Orientation    Disciplines:     Expected end:  08/15/18                 Explore reactions to and verbalize acceptance of impending loss     Problem: Anticipatory Grief    Disciplines:     Expected end:  -                 Demonstrate acceptance of terminal illness and understanding of disease progression     Problem: Coping and Emotional Distress    Disciplines:     Expected end:  -                 Distress heard, acknowledged, and addressed     Problem: Spiritual Distress    Disciplines:     Expected end:  -                 Demonstrate understanding of end of life processes     Problem: End of Life Process    Disciplines:     Expected end:  -                             ___________________    Care Team Notes          POC/IDG Notes      [facility] IDG Nurse Notes by Larene Pickett, RN at 08/08/18 1147  Version 1 of 1    Author:  Larene Pickett, RN Service:  Johny Shock Author Type:  Registered Nurse    Filed:  08/08/18 1152 Date of Service:  08/08/18 1147 Status:  Signed    Editor:  Larene Pickett, RN (Registered Nurse)       Patient: Ethan Good    Date: 08/08/18  Time: 11:47 AM    [facility] Nurse Notes  NPO for 8 days.  Foley patent with 325 mls uop.  Tachycardic 148 bpm.  Pt has eyes open, however is not tracking and following meaningful commands.  Morphine 2 mg x 6 doses IV, this was changed to Dilaudid 0.5 mg q 20 min.  Ativan 1 mg q 8 mg scheduled and Phenobarb 65 mg q 12 hours.  Haldol 2 mg  x 5 doses for agitation and Aivan 1 mg x 2 doses in the last 24 hours.  Comprehensive plan of care reviewed. IDG and pt./family in agreement with plan of care. The IDG identifies through on-going assessment when a change is needed to the POC; the pt/family will receive care and services necessitated by changes in POC. Medications reviewed by the pharmacist and Medical Director.          Signed by: Larene Pickett, RN       [facility] IDG Nurse Notes by Leo Rod B at 08/08/18 1146  Version 1 of 1    Author:  Leo Rod B Service:  Spiritual Care Author Type:  Pastoral Care    Filed:  08/08/18 1151 Date of Service:  08/08/18 1146 Status:  Signed    Editor:  Carloyn Manner (Pastoral Care)       Patient: Hoover Grewe    Date: 08/08/18  Time: 11:46 AM    HSPC Nurse Notes    Chaplain/ Bereavement Counselor has read the  Initial Comprehensive Assessment and Plan of Care for Fayrene Fearing  Gustavus Messing / Bereavement Counselor will complete and initial assessment for Bereavement and spiritual care and provide support according to needs.        Signed by: Carloyn Manner       [facility] IDG Social Worker Notes by Marliss Czar at 08/08/18 1126  Version 1 of 1    Author:  Marliss Czar Service:  Licensed Clinical Social Worker Author Type:  Social Worker    Filed:  08/08/18 1151 Date of Service:  08/08/18 1126 Status:  Signed    Editor:  Marliss Czar (Social Worker)       Patient: Ethan Good    Date: 08/08/18  Time: 11:26 AM    HSPC Case Manager Notes  LMSW will meet the pt and family to complete the SW assessment.  Pt will be supported by the volunteer program AEB comfort bag, visits for emotional support.    Pt is here unfunded due to an error in the consenting process.  He has Medicaid.       Signed by: Marliss Czar       [facility] IDG Volunteer Notes by Steffanie Dunn at 08/08/2018 1632  Version 1 of 1    Author:  Steffanie Dunn Service:  Johny Shock Author Type:  Hospice  Volunteer/Volunteer Coordinator    Filed:  08/02/2018 1640 Date of Service:  07/27/2018 1632 Status:  Signed    Editor:  Steffanie Dunn (Hospice Volunteer/Volunteer Coordinator)       Patient: Veto Macqueen    Date: 08/09/2018  Time: 4:32 PM    HSPC Volunteer Notes  VC has reviewed the initial RN Comprehensive assessment and the plan of care.  VC is in agreement with the plan of care. Pt will receive general support by volunteers as evidenced by comfort bag delivery, snack cart, supplies to the room, companionship visits, pet therapy and/or therapeutic music.             Signed by: Steffanie Dunn       [facility] IDG Social Worker Notes by Marliss Czar at 07/30/2018 1239  Version 1 of 1    Author:  Marliss Czar Service:  Licensed Clinical Social Worker Author Type:  Social Worker    Filed:  07/18/2018 1240 Date of Service:  07/18/2018 1239 Status:  Signed    Editor:  Marliss Czar (Social Worker)       Patient: Ethan Good    Date: 08/06/2018  Time: 12:39 PM    HSPC Case Manager Notes  LMSW has reviewed the Rn initial assessment and agrees with the POC.        Signed by: Marliss Czar       [facility] IDG Nurse Notes by Patsy [place], RN at 07/27/2018 1221  Version 1 of 1    Author:  Patsy Waskom, RN Service:  Johny Shock Author Type:  Registered Nurse    Filed:  07/26/2018 1222 Date of Service:  07/18/2018 1221 Status:  Signed    Editor:  Patsy Harper, RN (Registered Nurse)       Patient: Ethan Good    Date: 07/27/2018  Time: 12:21 PM    HSPC Nurse Notes  Pt admitted to [facility] for GIP level of care with hospice dx of sequelae of CVA and symptoms of pain and agitation. Pt was to be premedicated with morphine prior to transport. Pt has eyes open, but does not track. Left side is flaccid. IV  access present in left forearm. Skin is warm, dry and intact. RR 12 and unlabored with bil wheezing present. Pt does not appear to be in any distress at this time. No s/s of pain, agitation or dyspnea.    Foley cath inserted with return of clear amber colored urine. Pt did moan x 1 with insertion of cath, otherwise pt is non responsive. Family is not present.   Bed low and SR up with tab alert on.   Admission complete and initial general hospice care plan initiated which includes spiritual, psychosocial and bereavement. Immediate needs recognized for symptom management. IDG team made aware of plan of care and immediate needs.         Signed by: Patsy Bristow, RN                Care Team Present:   Team Members Present: Physician, Vol Coordinator, Nurse, Social worker, Chaplain, Bereavement  Physician Team Member: Francia Greaves MD  Nurse Team Member: Herschel Senegal RN  Social Work Team Member: Thersa Salt  MSW  Chaplain Team Member: Leo Rod   Vol Coordinator: Evans Lance

## 2018-08-08 NOTE — Progress Notes (Signed)
Problem: Falls - Risk of  Goal: *Absence of Falls  Description  Document Ethan Good Fall Risk and appropriate interventions in the flowsheet.  Outcome: Progressing Towards Goal  Note:   Fall Risk Interventions:            Medication Interventions: Bed/chair exit alarm    Elimination Interventions: Bed/chair exit alarm, Call light in reach              Problem: Pressure Injury - Risk of  Goal: *Prevention of pressure injury  Description  Document Braden Scale and appropriate interventions in the flowsheet.  Outcome: Progressing Towards Goal  Note:   Pressure Injury Interventions:  Sensory Interventions: Assess changes in LOC, Check visual cues for pain, Float heels, Keep linens dry and wrinkle-free, Minimize linen layers, Pad between skin to skin, Pressure redistribution bed/mattress (bed type)    Moisture Interventions: Absorbent underpads, Apply protective barrier, creams and emollients, Internal/External urinary devices, Limit adult briefs, Maintain skin hydration (lotion/cream), Minimize layers, Moisture barrier    Activity Interventions: Pressure redistribution bed/mattress(bed type)    Mobility Interventions: Float heels, HOB 30 degrees or less, Pressure redistribution bed/mattress (bed type)    Nutrition Interventions: (pt not taking PO at this time)    Friction and Shear Interventions: Apply protective barrier, creams and emollients, HOB 30 degrees or less, Lift sheet, Minimize layers                Problem: Pain  Goal: Verbalize satisfaction of level of comfort and symptom control  Description  Pt will remain free of pain during stay at [facility] aeb FLACC score of 2 or less  Medications:  Morphine 2 mg SQ/IV q 20 min PRN pain  Roxanol 10 mg PO/SL q 30 min PRN pain    Fentanyl 25 mcg TD patch q 72 hours   Outcome: Progressing Towards Goal     Problem: Anxiety/Agitation  Goal: Verbalize and demonstrate ability to manage anxiety  Description  The patient/family/caregiver will verbalize and demonstrate ability to  manage the patient's anxiety throughout hospice care.  Medication:  Haldol 2 mg SQ/IV q 1 hour PRN anxiety   Ativan 1 mg IV/IM q 2 hours PRN anxiety   Outcome: Progressing Towards Goal     Problem: Infection - Risk of, Urinary Catheter-Associated Urinary Tract Infection  Goal: *Absence of infection signs and symptoms  Description  Pt will remain free of infection during stay at [facility]   Outcome: Progressing Towards Goal     Problem: Infection - Risk of, Central Venous Catheter-Associated Bloodstream Infection  Goal: *Absence of infection signs and symptoms  Outcome: Progressing Towards Goal

## 2018-08-08 NOTE — Other (Signed)
Patient: Ethan Good    Date: 08/08/18  Time: 11:46 AM    HSPC Nurse Notes    Chaplain/ Bereavement Counselor has read the  Initial Comprehensive Assessment and Plan of Care for Gwenyth BenderJames Mandella.  Chaplain / Bereavement Counselor will complete and initial assessment for Bereavement and spiritual care and provide support according to needs.        Signed by: Carloyn Mannereresa B Suvi Archuletta

## 2018-08-08 NOTE — Other (Signed)
Patient: Ethan Good    Date: 08/08/18  Time: 11:47 AM    HSPC Nurse Notes  NPO for 8 days.  Foley patent with 325 mls uop.  Tachycardic 148 bpm.  Pt has eyes open, however is not tracking and following meaningful commands.  Morphine 2 mg x 6 doses IV, this was changed to Dilaudid 0.5 mg q 20 min.  Ativan 1 mg q 8 mg scheduled and Phenobarb 65 mg q 12 hours.  Haldol 2 mg x 5 doses for agitation and Aivan 1 mg x 2 doses in the last 24 hours.  Comprehensive plan of care reviewed. IDG and pt./family in agreement with plan of care. The IDG identifies through on-going assessment when a change is needed to the POC; the pt/family will receive care and services necessitated by changes in POC. Medications reviewed by the pharmacist and Medical Director.          Signed by: Larene PickettSonya S Gregory, RN

## 2018-08-08 NOTE — Progress Notes (Signed)
Problem: Falls - Risk of  Goal: *Absence of Falls  Description  Patient will remain free from falls during shift  Outcome: Progressing Towards Goal  Note:   Fall Risk Interventions:            Medication Interventions: Evaluate medications/consider consulting pharmacy, Bed/chair exit alarm    Elimination Interventions: Bed/chair exit alarm, Toileting schedule/hourly rounds              Problem: Pressure Injury - Risk of  Goal: *Prevention of pressure injury  Description  Patient skin will remain intact during shift aeb no skin breakdown or pressure sores.   Outcome: Progressing Towards Goal  Note:   Pressure Injury Interventions:  Sensory Interventions: Assess changes in LOC, Assess need for specialty bed, Avoid rigorous massage over bony prominences, Check visual cues for pain, Float heels, Minimize linen layers         Activity Interventions: Assess need for specialty bed    Mobility Interventions: Assess need for specialty bed, Float heels, HOB 30 degrees or less    Nutrition Interventions: Document food/fluid/supplement intake    Friction and Shear Interventions: Lift sheet, Minimize layers                Problem: Pain  Goal: Verbalize satisfaction of level of comfort and symptom control  Description  [facility] FLACC score of 2 or less  Medications:  Outcome: Progressing Towards Goal     Problem: Anxiety/Agitation  Goal: Verbalize and demonstrate ability to manage anxiety  Description  The patient/family/caregiver will verbalize and demonstrate ability to manage the patient's anxiety throughout hospice care.  The patient agitation will be controlled during shift aeb lack of pulling at clothing and medical devices and lack of trying to get out of bed.  Outcome: Progressing Towards Goal

## 2018-08-08 NOTE — Other (Signed)
Patient: Ethan Good    Date: 08/08/18  Time: 11:26 AM    HSPC Case Manager Notes  LMSW will meet the pt and family to complete the SW assessment.  Pt will be supported by the volunteer program AEB comfort bag, visits for emotional support.    Pt is here unfunded due to an error in the consenting process.  He has Medicaid.       Signed by: Marliss Czar

## 2018-08-08 NOTE — Progress Notes (Signed)
Progress Note    Patient: Ethan Good MRN: [id number]  SSN: [id number]    Date of Birth: 1961/02/15  Age: 57 y.o.  Sex: male      Admit Date: 08/05/2018    LOS: 1 day     Subjective:     Unresponsive. NPO. Has required Morphine IV x 6 for pain and dyspnea, Haldol x 5 for agitation and Ativan x 2 for agitation.     Review of Systems:  Review of systems not obtained due to patient factors.    Objective:     Vitals:    07/13/2018 1110 08/08/18 0518   BP: 158/90 111/77   Pulse: (!) 104 (!) 148   Resp: 12 20   Temp: 96.7 ??F (35.9 ??C) 97.1 ??F (36.2 ??C)        Intake and Output:  Current Shift: No intake/output data recorded.  Last three shifts: 09/25 1901 - 09/27 0700  In: -   Out: 325 [Urine:325]    Physical Exam:   GENERAL: mild distress, appears older than stated age, cachectic  LUNG: Coarse breath sounds with labored respirations.   HEART: regular rate and rhythm  ABDOMEN: soft, non-tender. Bowel sounds hypoactive.  GU: Foley catheter with clear amber urine. Inserted 9/26.  EXTREMITIES:  extremities with no cyanosis or edema. Weak pulses.   SKIN: Warm to touch. Right port accessed for medication administration.   NEUROLOGIC: Unresponsive. Unable to follow commands. Bedbound.   PSYCHIATRIC: agitated at times    Lab/Data Review:  No new labs resulted in the last 24 hours.    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 glycopyrrolate (ROBINUL) injection 0.2 mg  0.2 mg IntraVENous Q4H PRN   ??? bisacodyl (DULCOLAX) suppository 10 mg  10 mg Rectal PRN   ??? HYDROmorphone (DILAUDID) syringe 0.5 mg  0.5 mg SubCUTAneous Q30MIN PRN    Or    ??? HYDROmorphone (DILAUDID) syringe 0.5 mg  0.5 mg IntraVENous Q30MIN PRN   ??? LORazepam (ATIVAN) injection 1 mg  1 mg IntraVENous Q8H   ??? PHENobarbital (LUMINAL) injection 65 mg  65 mg SubCUTAneous Q12H   ??? fentaNYL (DURAGESIC) 25 mcg/hr patch 1 Patch  1 Patch TransDERmal Q72H   ??? sodium chloride (NS) flush 10 mL  10 mL InterCATHeter PRN   ??? heparin (porcine) pf 300 Units  300 Units InterCATHeter PRN   ??? acetaminophen (TYLENOL) suppository 650 mg  650 mg Rectal Q3H PRN   ??? LORazepam (ATIVAN) injection 2 mg  2 mg IntraVENous Q10MIN PRN   ??? sodium chloride (NS) flush 10 mL  10 mL IntraVENous Q12H   ??? heparin (porcine) pf 300 Units  300 Units InterCATHeter Q12H   ??? LORazepam (ATIVAN) injection 1 mg  1 mg IntraVENous Q2H PRN       9/26: ([facility]) Admitted GIP with sequelae of stroke for management of pain, dyspnea, agitation and seizures.    1. Pain: Hydromorphone 0.5mg  IV/SQ Q30 minutes as needed.  Fentanyl 25mcg patch as ordered.    2. Dyspnea: Hydromorphone 0.5mg  IV/SQ Q30 minutes as needed. Glycopyrrolate 0.2mg  q4 prn  secretions. Oxygen as ordered.    3. Agitation: Lorazepam 1mg  IV/IM q4 hours prn.    4. Seizures: Phenobarbital 65mg  SQ Q12 and Lorazepam 1mg  IV Q8 and  prn sustained seizure activity and notify provider.     5. Family/Pt Support: No family at bedside during exam. Medications and plan of care discussed with nursing staff. Will continue to monitor for symptoms and adjust medications as needed to maintain patient comfort.  PPS 10%. Case discussed with Dr. Iona Hansen and in [facility] meeting today. Add Ativan and Phenobarbital for seizure prevention. Change morphine to dilaudid due to liver metastasis.       Signed By: Evalee Jefferson, NP     August 08, 2018

## 2018-08-09 MED FILL — PHENOBARBITAL SODIUM 65 MG/ML INJECTION: 65 mg/mL | INTRAMUSCULAR | Qty: 1

## 2018-08-09 MED FILL — HYDROMORPHONE 0.5 MG/0.5 ML SYRINGE: 0.5 mg/ mL | INTRAMUSCULAR | Qty: 0.5

## 2018-08-09 MED FILL — LORAZEPAM 2 MG/ML IJ SOLN: 2 mg/mL | INTRAMUSCULAR | Qty: 1

## 2018-08-09 MED FILL — ACEPHEN 650 MG RECTAL SUPPOSITORY: 650 mg | RECTAL | Qty: 1

## 2018-08-09 NOTE — Progress Notes (Addendum)
Walking rounds completed. Pt identified by name/DOB. Eyes closed. Respirations non labored. Fentanyl patch observed intact to left chest. Facial features flat,relaxed. Flacc 0/10. Pt is GIP level of care with Dx of  Sequelae of CVA. Flacc 0/10. Bed in low and locked position with side rails up x 2 and call light in reach.     2146 Scheduled pm medications given at this time. Port WNL. Respirations non labored. Flacc 0/10. Pt is resting peacefully. All comfort/safety measures continue. Door left open as pt is unaccompanied.    0000 Eyes closed. Respirations non labored. No hiccoughs at this time. Facial features flat,relaxed. Flacc 0/10. Bed in low and locked position with side rails up x 2 with door left open as pt is unaccompanied.       0300 Eyes closed. Respirations non distressed. Flacc 0/10. All care continued.     3244 Scheduled Ativan 1 mg given IV as ordered. Dilaudid 0.5 mg given IV for dyspnea. No evidence of discomfort. Facial features flat,relaxed. Flacc 0/10. HOB elevated.       0543 Respirations non labored at 20/ minute at this time. Pt appears peaceful.

## 2018-08-09 NOTE — Progress Notes (Addendum)
Report received. Pt round. Pt identified by name. In bed with eyes closed. Fentanyl patch in place on left chest. No s/s of pain, agitation or dyspnea. Bed low and SR up with tab alerts on.    0847 Dilaudid 0.5 mg IV for premedication prior to AM care.     1036 Ativan 1 mg IV for agitation     1130 Pt resting with no s/s of distress at this time.     1331 Scheduled ativan given. Pt appears peaceful at this time with no s/s of agitation or pain.     1529 Tylenol supp for temp 101.3 ax and Dilaudid 0.5 mg IV for dyspnea. RR 30 and labored.     1614 Dilaudid 0.5 mg IV for dyspnea RR 24.     1713 Dilaudid 0.5 mg IV for dyspnea. RR 32    1738  RR 24    1811  RR remains at 24. Pt appears comfortable at this time.    Report given to oncoming RN

## 2018-08-09 NOTE — Progress Notes (Signed)
Problem: Falls - Risk of  Goal: *Absence of Falls  Description  Document Ethan Good Fall Risk and appropriate interventions in the flowsheet.  Outcome: Progressing Towards Goal  Note:   Fall Risk Interventions:            Medication Interventions: Bed/chair exit alarm    Elimination Interventions: Bed/chair exit alarm, Call light in reach              Problem: Pressure Injury - Risk of  Goal: *Prevention of pressure injury  Description  Document Braden Scale and appropriate interventions in the flowsheet.  Outcome: Progressing Towards Goal  Note:   Pressure Injury Interventions:  Sensory Interventions: Assess changes in LOC, Check visual cues for pain, Float heels, Keep linens dry and wrinkle-free, Minimize linen layers, Pad between skin to skin, Pressure redistribution bed/mattress (bed type)    Moisture Interventions: Absorbent underpads, Apply protective barrier, creams and emollients, Limit adult briefs, Internal/External urinary devices, Maintain skin hydration (lotion/cream), Minimize layers, Moisture barrier    Activity Interventions: Pressure redistribution bed/mattress(bed type)    Mobility Interventions: Float heels, HOB 30 degrees or less, Pressure redistribution bed/mattress (bed type)    Nutrition Interventions: (pt not taking PO at this time)    Friction and Shear Interventions: Apply protective barrier, creams and emollients, HOB 30 degrees or less, Lift sheet, Minimize layers                Problem: Pain  Goal: Verbalize satisfaction of level of comfort and symptom control  Description  Pt will remain free of pain during stay at [facility] aeb FLACC score of 2 or less  Medications:  Morphine 2 mg SQ/IV q 20 min PRN pain  Roxanol 10 mg PO/SL q 30 min PRN pain    Fentanyl 25 mcg TD patch q 72 hours   Outcome: Progressing Towards Goal     Problem: Anxiety/Agitation  Goal: Verbalize and demonstrate ability to manage anxiety  Description  The patient/family/caregiver will verbalize and demonstrate ability to  manage the patient's anxiety throughout hospice care.  Medication:  Haldol 2 mg SQ/IV q 1 hour PRN anxiety   Ativan 1 mg IV/IM q 2 hours PRN anxiety   Outcome: Progressing Towards Goal     Problem: Infection - Risk of, Urinary Catheter-Associated Urinary Tract Infection  Goal: *Absence of infection signs and symptoms  Description  Pt will remain free of infection during stay at [facility]   Outcome: Progressing Towards Goal     Problem: Infection - Risk of, Central Venous Catheter-Associated Bloodstream Infection  Goal: *Absence of infection signs and symptoms  Outcome: Progressing Towards Goal     Problem: Patient Education: Go to Patient Education Activity  Goal: Patient/Family Education  Outcome: Not Progressing Towards Goal  Note:   Pt is obtunded and family not present     Problem: Patient Education: Go to Patient Education Activity  Goal: Patient/Family Education  Outcome: Not Progressing Towards Goal  Note:   Pt is obtunded and family not present     Problem: Hospice Orientation  Goal: Demonstrate understanding of hospice philosophy, plan of care, and home hospice program  Description  The patient/family/caregiver will demonstrate understanding of hospice philosophy, plan of care and the home hospice program as evidenced by participation in meeting the patient's psychosocial, spiritual, medical, and physical needs inclusive of medical supplies/equipment focusing on symptoms.  Outcome: Not Progressing Towards Goal  Note:   Pt is obtunded and family not present

## 2018-08-09 NOTE — Progress Notes (Signed)
Problem: Falls - Risk of  Goal: *Absence of Falls  Description  Document Ethan Good Fall Risk and appropriate interventions in the flowsheet.  Outcome: Progressing Towards Goal  Note:   Fall Risk Interventions:            Medication Interventions: Bed/chair exit alarm    Elimination Interventions: Bed/chair exit alarm, Call light in reach, Toileting schedule/hourly rounds              Problem: Pressure Injury - Risk of  Goal: *Prevention of pressure injury  Description  Document Braden Scale and appropriate interventions in the flowsheet.  Outcome: Progressing Towards Goal  Note:   Pressure Injury Interventions:  Sensory Interventions: Assess changes in LOC, Assess need for specialty bed, Avoid rigorous massage over bony prominences, Check visual cues for pain, Float heels, Keep linens dry and wrinkle-free, Minimize linen layers    Moisture Interventions: Absorbent underpads, Apply protective barrier, creams and emollients, Assess need for specialty bed, Internal/External urinary devices, Limit adult briefs, Maintain skin hydration (lotion/cream), Minimize layers, Moisture barrier    Activity Interventions: Pressure redistribution bed/mattress(bed type)    Mobility Interventions: Float heels, HOB 30 degrees or less, Pressure redistribution bed/mattress (bed type)    Nutrition Interventions: (unable)    Friction and Shear Interventions: Apply protective barrier, creams and emollients, Foam dressings/transparent film/skin sealants, HOB 30 degrees or less, Lift sheet, Minimize layers                Problem: Pain  Goal: Verbalize satisfaction of level of comfort and symptom control  Description  Pt will remain free of pain during stay at [facility] aeb FLACC score of 2 or less  Medications:  Morphine 2 mg SQ/IV q 20 min PRN pain  Roxanol 10 mg PO/SL q 30 min PRN pain    Fentanyl 25 mcg TD patch q 72 hours   Outcome: Progressing Towards Goal     Problem: Anxiety/Agitation   Goal: Verbalize and demonstrate ability to manage anxiety  Description  The patient/family/caregiver will verbalize and demonstrate ability to manage the patient's anxiety throughout hospice care.  Medication:  Haldol 2 mg SQ/IV q 1 hour PRN anxiety   Ativan 1 mg IV/IM q 2 hours PRN anxiety   Outcome: Progressing Towards Goal

## 2018-08-10 MED FILL — LORAZEPAM 2 MG/ML IJ SOLN: 2 mg/mL | INTRAMUSCULAR | Qty: 1

## 2018-08-10 MED FILL — HYDROMORPHONE 0.5 MG/0.5 ML SYRINGE: 0.5 mg/ mL | INTRAMUSCULAR | Qty: 0.5

## 2018-08-10 MED FILL — PHENOBARBITAL SODIUM 65 MG/ML INJECTION: 65 mg/mL | INTRAMUSCULAR | Qty: 1

## 2018-08-10 NOTE — Progress Notes (Signed)
Problem: Dyspnea Due to End of Life  Goal: Demonstrate understanding of and ability to manage respiratory symptoms at end of life  09/05/18 0336 by Suszanne Conners, RN  Outcome: Progressing Towards Goal  September 05, 2018 0335 by Suszanne Conners, RN  Note:   O2 2L prn respiratory distress  Dilaudid 0.5 mg IV every 30 mins prn dyspnea  09/05/18 0331 by Suszanne Conners, RN  Outcome: Progressing Towards Goal

## 2018-08-10 NOTE — Progress Notes (Signed)
Problem: Pressure Injury - Risk of  Goal: *Prevention of pressure injury  Description  Document Braden Scale and appropriate interventions in the flowsheet.  Outcome: Progressing Towards Goal  Note:   Pressure Injury Interventions:  Sensory Interventions: Assess changes in LOC, Avoid rigorous massage over bony prominences, Check visual cues for pain, Float heels, Keep linens dry and wrinkle-free, Pressure redistribution bed/mattress (bed type)    Moisture Interventions: Absorbent underpads, Apply protective barrier, creams and emollients, Check for incontinence Q2 hours and as needed, Internal/External urinary devices, Limit adult briefs, Maintain skin hydration (lotion/cream), Minimize layers, Moisture barrier    Activity Interventions: Pressure redistribution bed/mattress(bed type)    Mobility Interventions: Float heels, HOB 30 degrees or less, Pressure redistribution bed/mattress (bed type)    Nutrition Interventions: (unable )    Friction and Shear Interventions: Apply protective barrier, creams and emollients, HOB 30 degrees or less, Lift sheet, Minimize layers                Problem: Pain  Goal: Verbalize satisfaction of level of comfort and symptom control  Description  [facility] FLACC score of 2 or less  Medications:  Morphine 2 mg SQ/IV q 20 min PRN pain  Roxanol 10 mg PO/SL q 30 min PRN pain    Fentanyl 25 mcg TD patch q 72 hours   Outcome: Progressing Towards Goal     Problem: Anxiety/Agitation  Goal: Verbalize and demonstrate ability to manage anxiety  Description  The patient/family/caregiver will verbalize and demonstrate ability to manage the patient's anxiety throughout hospice care.  Medication:  Haldol 2 mg SQ/IV q 1 hour PRN anxiety   Ativan 1 mg IV/IM q 2 hours PRN anxiety   Outcome: Progressing Towards Goal     Problem: Infection - Risk of, Urinary Catheter-Associated Urinary Tract Infection  Goal: *Absence of infection signs and symptoms  Description   [facility]   Outcome: Progressing Towards Goal

## 2018-08-10 NOTE — Progress Notes (Deleted)
Problem: Dyspnea Due to End of Life  Goal: Demonstrate understanding of and ability to manage respiratory symptoms at end of life  Outcome: Progressing Towards Goal

## 2018-08-10 NOTE — Progress Notes (Addendum)
Walking rounds completed with off going RN. Pt identified by name/DOB. Eyes closed. Respirations non labored. Fentanyl patch visualized intact. No pain,nausea,anxiety or distress noted. Facial features flat,relaxed. Flacc 0/10. Bed in low and locked position with side rails up x 2 and call light in reach.     2040 Pt without v/s or response to any stimuli  per observation, auscultation and palpation.    2057 Brother notified and requests body to be released to [facility] for cremation. Brother is coping well and is in [place]. He will not be visiting facility. Emotional support provided.     0000 Body released to [facility] at this time

## 2018-08-10 NOTE — Progress Notes (Addendum)
The patient report and walking rounds completed with the off going RN. The patient was identified by name and DOB. He shows no signs of distress at this time.  Pain 0/10. No signs of anxiety, SOB or nausea / vomit observed. The bed remains low and locked with two bed rails up for safety. The room is near to the nurses station and the door to the room is left open.     0957- AM scheduled medications administered as ordered. Mouth care completed.Patient was repositioned for comfort.     1230- No signs of distress observed. Patient was repositioned for comfort. Mouth care done.     1440- Floated the patient on pillows for comfort. Heels are floated.     1610- Spoke with Claudie Fisherman, brother of the patient, who stated that [facility] would use [facility] when the time came. Recorded on the chart. Moved the left pillow. Patient is on the left side. Patient remains obtunded.       Reported off to Drue Novel, RN and completed walking rounds.

## 2018-08-12 DEATH — deceased

## 2018-12-26 IMAGING — CT CT CHEST W/O CM
2 of 3 series · 15 of 36 positions shown, 18 images · non-contrast
Comparison: 01/31/2018 CT abdomen/pelvis.

CLINICAL DATA: New diagnosis of cholangiocarcinoma status post ERCP
with plastic CBD stent placement. Chest staging.

EXAM:
CT CHEST WITHOUT CONTRAST
TECHNIQUE: Multidetector CT imaging of the chest was performed following the
standard protocol without IV contrast.

[Series 2: thorax · axial · 0.61mm/px · z∈[-289,-35]mm · 12 of 149 slices shown, 15 images]
[im 11/149  mediastinal]
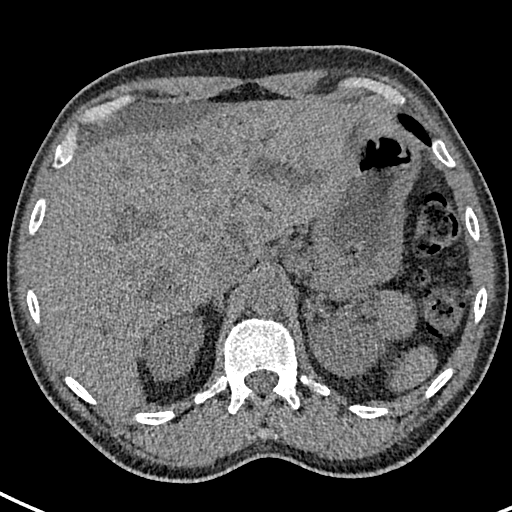
[im 11/149  lung]
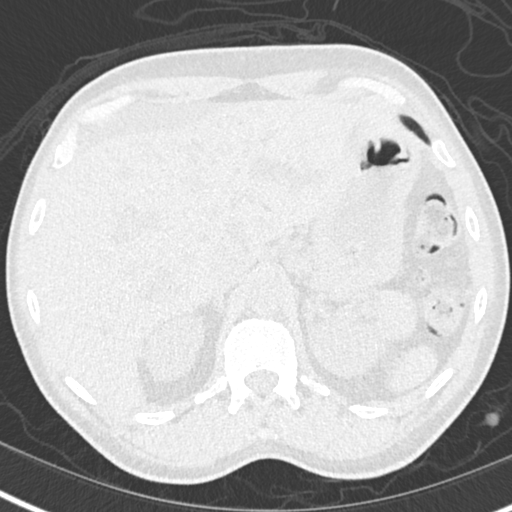
[im 22/149  lung]
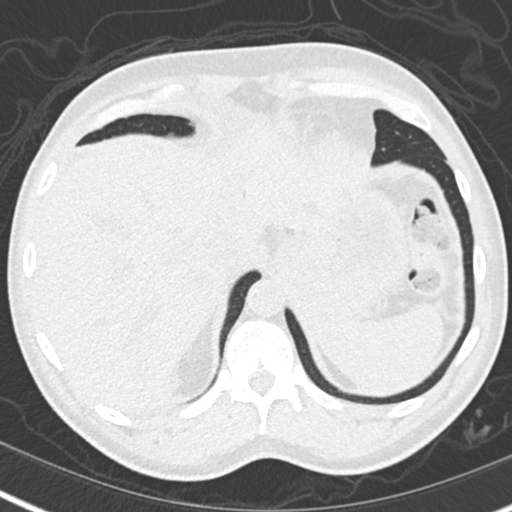
[im 33/149  lung]
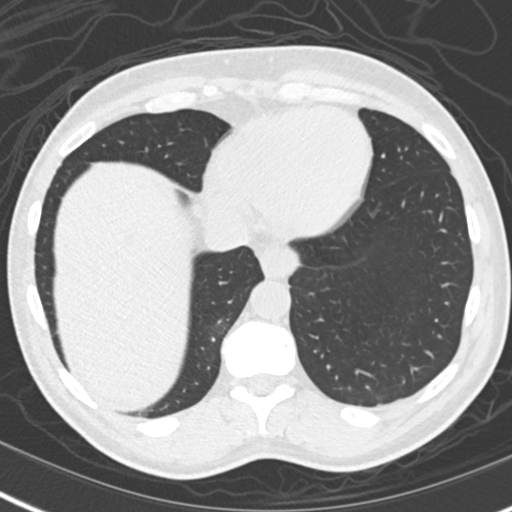
[im 44/149  lung]
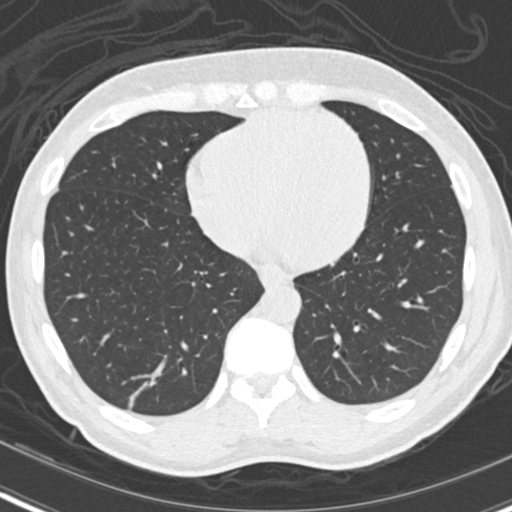
[im 55/149  mediastinal]
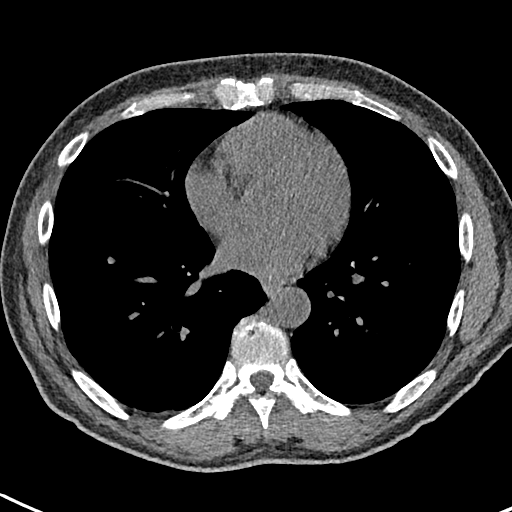
[im 55/149  lung]
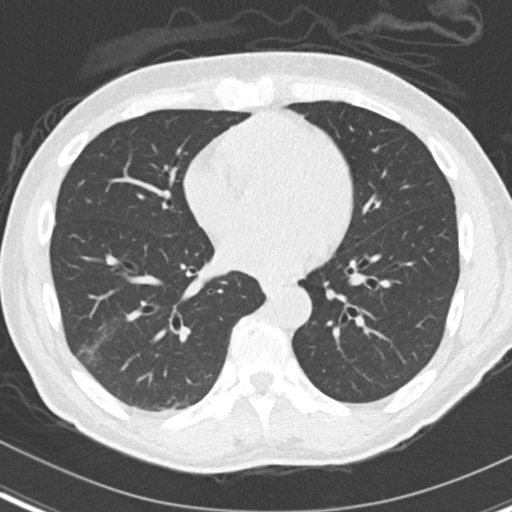
[im 66/149  lung]
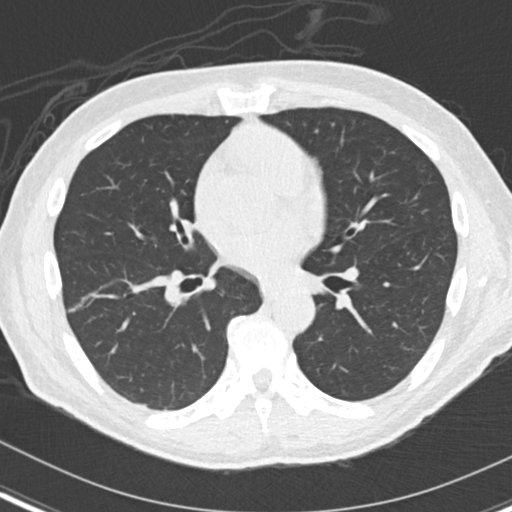
[im 83/149  lung]
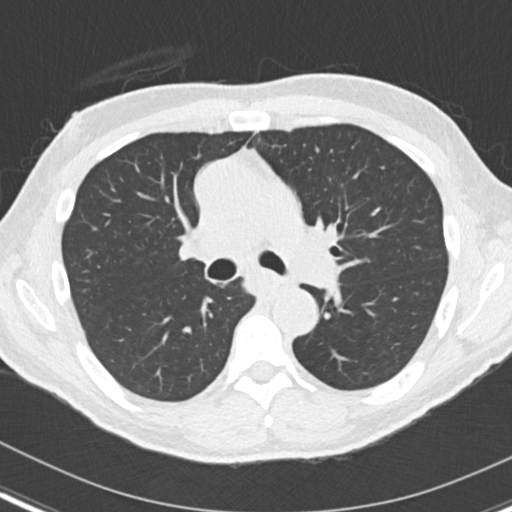
[im 94/149  lung]
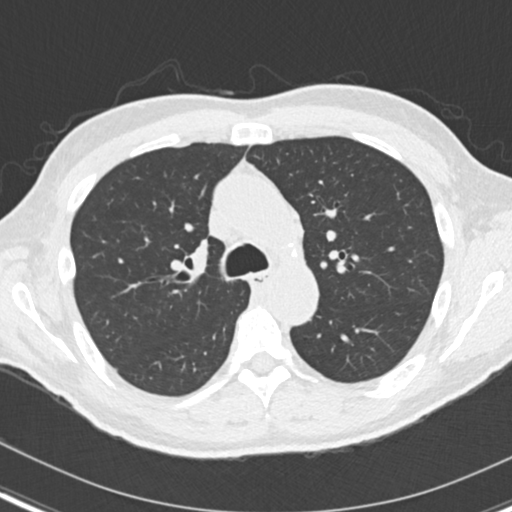
[im 105/149  mediastinal]
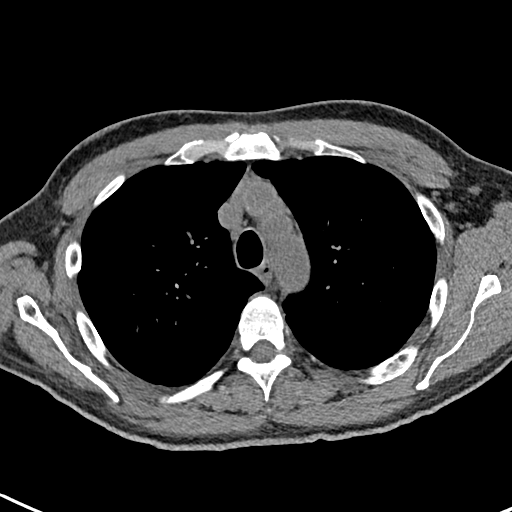
[im 105/149  lung]
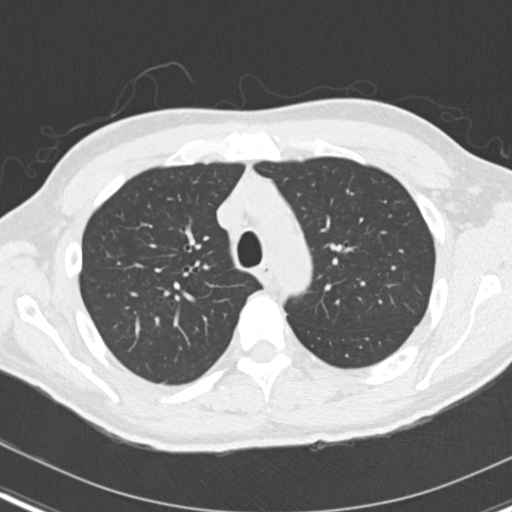
[im 116/149  lung]
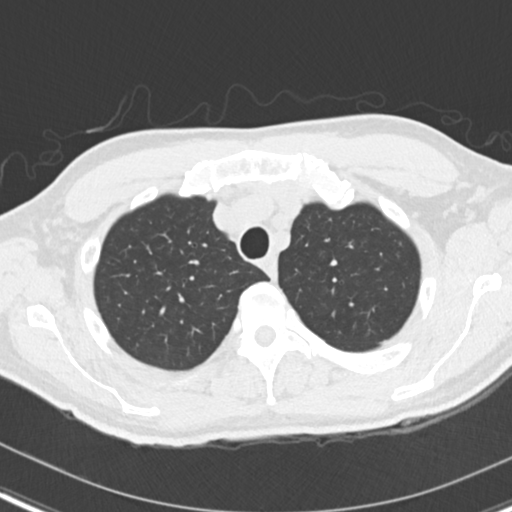
[im 127/149  lung]
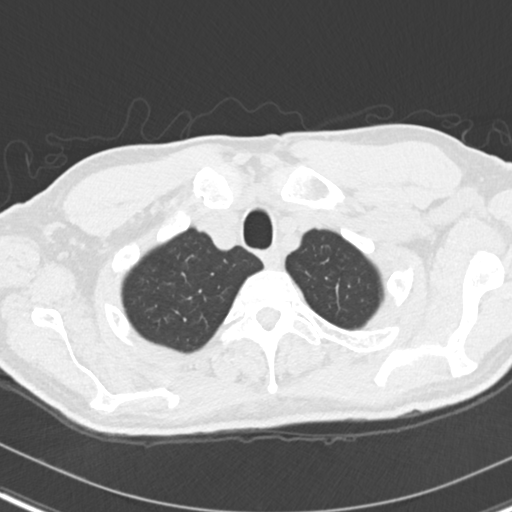
[im 138/149  lung]
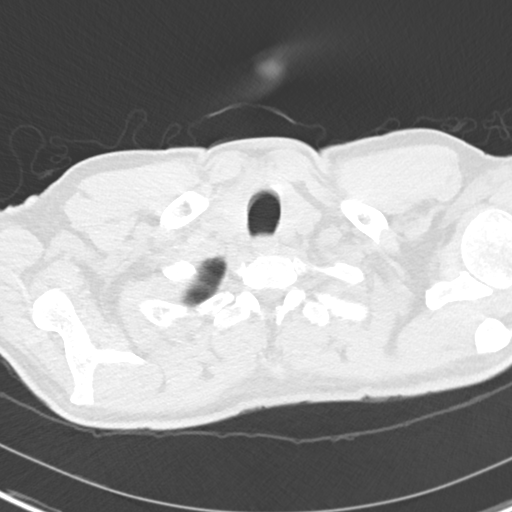

[Series 6: coronal · coronal · 0.59mm/px · 3 of 128 slices shown]
[im 26/128  lung]
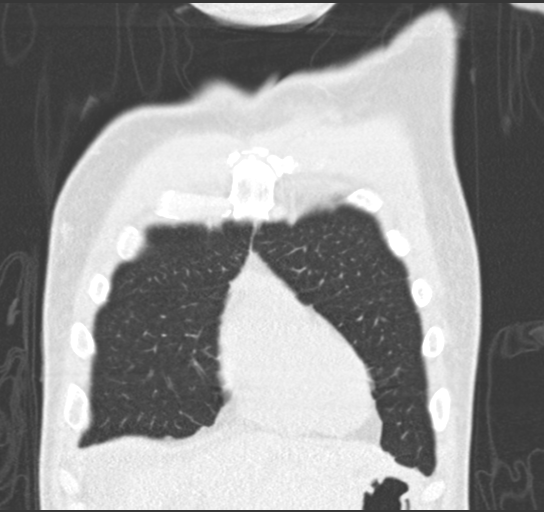
[im 51/128  lung]
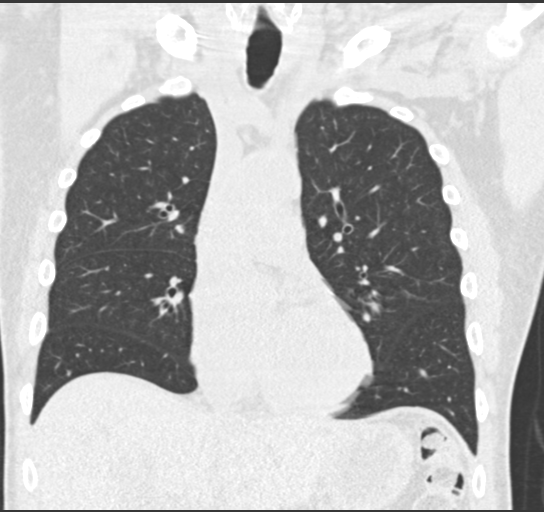
[im 77/128  lung]
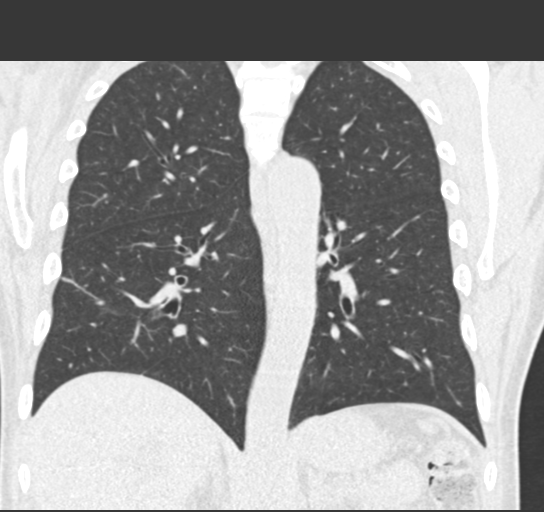

[15 of 36 positions shown; findings below may reference images not displayed]

FINDINGS: Cardiovascular: Normal heart size. No significant pericardial
fluid/thickening. Mildly atherosclerotic nonaneurysmal thoracic
aorta. Normal caliber pulmonary arteries.

Mediastinum/Nodes: No discrete thyroid nodules. There is a soft
tissue density mass in the subcarinal region measuring 1.7 cm short
axis (series 2/image 64), intimately associated with the wall of the
midthoracic esophagus, presumably an enlarged paraesophageal node.
No axillary adenopathy. Otherwise no pathologically enlarged
mediastinal nodes. No discrete hilar adenopathy on these noncontrast
images.

Lungs/Pleura: No pneumothorax. No pleural effusion. No acute
consolidative airspace disease, lung masses or significant pulmonary
nodules. Small parenchymal bands in the right lower lobe, compatible
with mild scarring or atelectasis.

Upper abdomen: Redemonstration diffuse intrahepatic biliary ductal
dilatation and abnormal soft tissue density in the central portal
tracts in the visualized liver with stable small volume perihepatic
ascites. Expected pneumobilia in the left liver lobe status post
reported CBD stent placement. CBD stent is visualized in the medial
right upper quadrant on the scout topogram.

Musculoskeletal: No aggressive appearing focal osseous lesions. Mild
thoracic spondylosis.
IMPRESSION: 1. Pathologically enlarged paraesophageal/subcarinal lymph node
suspicious for mediastinal nodal metastasis.
2. No additional potential findings of metastatic disease in the
chest.
3. Redemonstration of diffuse intrahepatic biliary ductal
dilatation, abnormal soft tissue density in the central portal
tracts and small volume perihepatic ascites, compatible with known
cholangiocarcinoma.

Aortic Atherosclerosis ([id number]).
